# Patient Record
Sex: Female | Born: 1975 | Hispanic: Yes | Smoking: Never smoker
Health system: Southern US, Community
[De-identification: ages and names within clinical notes are randomized; demographics above are authoritative.]

## PROBLEM LIST (undated history)

## (undated) DIAGNOSIS — N393 Stress incontinence (female) (male): Secondary | ICD-10-CM

## (undated) DIAGNOSIS — N83202 Unspecified ovarian cyst, left side: Secondary | ICD-10-CM

## (undated) DIAGNOSIS — I4711 Inappropriate sinus tachycardia, so stated: Secondary | ICD-10-CM

## (undated) DIAGNOSIS — Z8489 Family history of other specified conditions: Secondary | ICD-10-CM

## (undated) DIAGNOSIS — N920 Excessive and frequent menstruation with regular cycle: Secondary | ICD-10-CM

## (undated) DIAGNOSIS — D649 Anemia, unspecified: Secondary | ICD-10-CM

## (undated) DIAGNOSIS — N946 Dysmenorrhea, unspecified: Secondary | ICD-10-CM

## (undated) DIAGNOSIS — I7300 Raynaud's syndrome without gangrene: Secondary | ICD-10-CM

## (undated) DIAGNOSIS — R5382 Chronic fatigue, unspecified: Secondary | ICD-10-CM

## (undated) DIAGNOSIS — R002 Palpitations: Secondary | ICD-10-CM

## (undated) HISTORY — PX: TUBAL LIGATION: SHX77

---

## 2017-11-17 ENCOUNTER — Ambulatory Visit
Admission: EM | Admit: 2017-11-17 | Discharge: 2017-11-17 | Disposition: A | Discharge status: Home / Self Care | Payer: Medicaid Other | Attending: Family Medicine | Admitting: Family Medicine

## 2017-11-17 ENCOUNTER — Encounter: Payer: Self-pay | Admitting: *Deleted

## 2017-11-17 DIAGNOSIS — R040 Epistaxis: Secondary | ICD-10-CM

## 2017-11-17 DIAGNOSIS — G43009 Migraine without aura, not intractable, without status migrainosus: Secondary | ICD-10-CM

## 2017-11-17 MED ORDER — HYDROCODONE-ACETAMINOPHEN 5-325 MG PO TABS: ORAL_TABLET | ORAL | 0 refills | Status: DC

## 2017-11-17 NOTE — ED Triage Notes (Signed)
Intermittent headache for past month. Spontaneous nosebleed and headache while seated at rest approx 1530 today. States stabbing type pain to left occipital region which is worse today than previously.

## 2017-11-20 ENCOUNTER — Telehealth: Payer: Self-pay

## 2017-11-20 NOTE — Telephone Encounter (Signed)
Called to follow up with patient [facility].No answer. No VM set up. Patient will call back with any questions or concerns. [facility]

## 2018-02-23 NOTE — ED Provider Notes (Signed)
[facility]    CSN: [id number] Arrival date & time: 11/17/17  1739     History   Chief Complaint Chief Complaint  Patient presents with  . Headache  . Epistaxis    HPI Lauren Barron is a 42 y.o. female.   42 yo female with a h/o migraine headaches, presents with a c/o intermittent migraine headaches for the past month. Today patient had a nosebleed which has since resolved. Denies any fevers, chills, vision changes, one-sided weakness, "worst headache ever", injuries.   The history is provided by the patient.  Headache  Epistaxis  Associated symptoms: headaches     History reviewed. No pertinent past medical history.  There are no active problems to display for this patient.   Past Surgical History:  Procedure Laterality Date  . CESAREAN SECTION      OB History   None      Home Medications    Prior to Admission medications   Medication Sig Start Date End Date Taking? Authorizing Provider  HYDROcodone-acetaminophen (NORCO/VICODIN) 5-325 MG tablet 1-2 tabs po q 8 hours prn 11/17/17   Payton Mccallumonty, Brittley Regner, MD    Family History Family History  Problem Relation Age of Onset  . Hypertension Mother   . Diabetes Father     Social History Social History   Tobacco Use  . Smoking status: Never Smoker  . Smokeless tobacco: Never Used  Substance Use Topics  . Alcohol use: No    Frequency: Never  . Drug use: No     Allergies   Aspirin   Review of Systems Review of Systems  HENT: Positive for nosebleeds.   Neurological: Positive for headaches.     Physical Exam Triage Vital Signs ED Triage Vitals  Enc Vitals Group     BP 11/17/17 1824 117/67     Pulse Rate 11/17/17 1824 82     Resp 11/17/17 1824 16     Temp 11/17/17 1824 98.8 F (37.1 C)     Temp Source 11/17/17 1824 Oral     SpO2 11/17/17 1824 100 %     Weight 11/17/17 1826 106 lb (48.1 kg)     Height 11/17/17 1826 5\' 4"  (1.626 m)     Head Circumference --      Peak Flow --        Pain Score 11/17/17 1827 5     Pain Loc --      Pain Edu? --      Excl. in GC? --    No data found.  Updated Vital Signs BP 117/67 (BP Location: Left Arm)   Pulse 94   Temp 98.8 F (37.1 C) (Oral)   Resp 16   Ht 5\' 4"  (1.626 m)   Wt 106 lb (48.1 kg)   LMP 11/03/2017 (Approximate)   SpO2 100%   BMI 18.19 kg/m   Visual Acuity Right Eye Distance:   Left Eye Distance:   Bilateral Distance:    Right Eye Near:   Left Eye Near:    Bilateral Near:     Physical Exam  Constitutional: She is oriented to person, place, and time. She appears well-developed and well-nourished. No distress.  HENT:  Head: Normocephalic.  Right Ear: Tympanic membrane, external ear and ear canal normal.  Left Ear: Tympanic membrane, external ear and ear canal normal.  Nose: Nose normal. No epistaxis.  Mouth/Throat: Oropharynx is clear and moist and mucous membranes are normal.  Eyes: Pupils are equal, round, and reactive to light.  Conjunctivae and EOM are normal. Right eye exhibits no discharge. Left eye exhibits no discharge. No scleral icterus.  Neck: Normal range of motion. Neck supple. No JVD present. No tracheal deviation present. No thyromegaly present.  Cardiovascular: Normal rate, regular rhythm, normal heart sounds and intact distal pulses.  No murmur heard. Pulmonary/Chest: Effort normal and breath sounds normal. No stridor. No respiratory distress. She has no wheezes. She has no rales. She exhibits no tenderness.  Musculoskeletal: She exhibits no edema or tenderness.  Lymphadenopathy:    She has no cervical adenopathy.  Neurological: She is alert and oriented to person, place, and time. She has normal reflexes. She displays normal reflexes. No cranial nerve deficit or sensory deficit. She exhibits normal muscle tone. Coordination normal.  Skin: Skin is warm and dry. No rash noted. She is not diaphoretic. No erythema. No pallor.  Psychiatric: She has a normal mood and affect. Her behavior  is normal. Judgment and thought content normal.  Vitals reviewed.    UC Treatments / Results  Labs (all labs ordered are listed, but only abnormal results are displayed) Labs Reviewed - No data to display  EKG None Radiology No results found.  Procedures Procedures (including critical care time)  Medications Ordered in UC Medications - No data to display   Initial Impression / Assessment and Plan / UC Course  I have reviewed the triage vital signs and the nursing notes.  Pertinent labs & imaging results that were available during my care of the patient were reviewed by me and considered in my medical decision making (see chart for details).       Final Clinical Impressions(s) / UC Diagnoses   Final diagnoses:  Migraine without aura and without status migrainosus, not intractable  Epistaxis    ED Discharge Orders        Ordered    HYDROcodone-acetaminophen (NORCO/VICODIN) 5-325 MG tablet     11/17/17 1849     1. diagnosis reviewed with patient 2. rx as per orders above; reviewed possible side effects, interactions, risks and benefits  3. Recommend supportive treatment with otc tylenol prn 4. Follow-up prn if symptoms worsen or don't improve  Controlled Substance Prescriptions [place] Controlled Substance Registry consulted? Not Applicable   Payton Mccallum, MD 02/23/18 [phone]

## 2018-04-27 ENCOUNTER — Encounter (INDEPENDENT_AMBULATORY_CARE_PROVIDER_SITE_OTHER): Payer: Self-pay

## 2018-04-27 ENCOUNTER — Ambulatory Visit
Admission: RE | Admit: 2018-04-27 | Discharge: 2018-04-27 | Disposition: A | Discharge status: Home / Self Care | Payer: Medicaid Other | Source: Ambulatory Visit | Attending: Oncology | Admitting: Oncology

## 2018-04-27 ENCOUNTER — Ambulatory Visit: Payer: Self-pay | Attending: Oncology

## 2018-04-27 VITALS — BP 107/70 | HR 87 | Temp 99.0°F | Ht 66.0 in | Wt 107.0 lb

## 2018-04-27 DIAGNOSIS — N63 Unspecified lump in unspecified breast: Secondary | ICD-10-CM

## 2018-04-27 NOTE — Progress Notes (Signed)
  Subjective:     Patient ID: Lauren MulliganZoar Moran Barron, female   DOB: 03/25/1976, 42 y.o.   MRN: [id number]  HPI   Review of Systems     Objective:   Physical Exam  Pulmonary/Chest: Right breast exhibits no inverted nipple, no mass, no nipple discharge, no skin change and no tenderness. Left breast exhibits mass. Left breast exhibits no inverted nipple (1.5 cm left breast mass 2:30 4 cm. from areola), no nipple discharge, no skin change and no tenderness. Breasts are symmetrical.         Assessment:     42 year old hispanic [place] from [place] [facility].  States she felt left breast lump over a week ago, and is also able to see lump.  Patient screened, and meets B[facility]CP eligibility.  Patient does not have insurance, Medicare or Medicaid.  Handout given on Affordable Care Act.  Instructed patient on breast self awareness using teach back method.  Palpated 1.5 cm, firm , mobile mass at 2:30 left breast, 4cm. from areola.  Explained benefit of being in [facility].  Patient states she is a [place] Citizen.      Plan:     Sent for bilateral diagnostic mammogram, and ultrasound.

## 2018-05-01 NOTE — Progress Notes (Signed)
Letter mailed from [facility] to notify of normal mammogram results.  Patient to return in one year for annual screening.  Copy to [facility]. 

## 2020-01-21 ENCOUNTER — Ambulatory Visit: Payer: Medicaid Other | Attending: Internal Medicine

## 2020-01-21 DIAGNOSIS — Z23 Encounter for immunization: Secondary | ICD-10-CM

## 2020-01-21 NOTE — Progress Notes (Signed)
   Covid-19 Vaccination Clinic  Name:  Lauren Barron    MRN: [id number] DOB: 10/15/1976  01/21/2020  Ms. Lauren Barron was observed post Covid-19 immunization for 15 minutes without incident. She was provided with Vaccine Information Sheet and instruction to access the V-Safe system.   Ms. Lauren Barron was instructed to call 911 with any severe reactions post vaccine: Marland Kitchen Difficulty breathing  . Swelling of face and throat  . A fast heartbeat  . A bad rash all over body  . Dizziness and weakness   Immunizations Administered    Name Date Dose VIS Date Route   Pfizer COVID-19 Vaccine 01/21/2020 11:28 AM 0.3 mL 10/13/2019 Intramuscular   Manufacturer: [employer], [employer]   Lot: [id number]   NDC: [address]-6

## 2020-02-11 ENCOUNTER — Ambulatory Visit: Payer: Medicaid Other

## 2020-02-25 ENCOUNTER — Ambulatory Visit: Payer: Medicaid Other | Attending: Internal Medicine

## 2020-02-25 DIAGNOSIS — Z23 Encounter for immunization: Secondary | ICD-10-CM

## 2020-02-25 NOTE — Progress Notes (Signed)
   Covid-19 Vaccination Clinic  Name:  Lauren Barron    MRN: [id number] DOB: 09-03-1976  02/25/2020  Ms. Lauren Barron was observed post Covid-19 immunization for 15 minutes without incident. She was provided with Vaccine Information Sheet and instruction to access the V-Safe system.   Ms. Lauren Barron was instructed to call 911 with any severe reactions post vaccine: Marland Kitchen Difficulty breathing  . Swelling of face and throat  . A fast heartbeat  . A bad rash all over body  . Dizziness and weakness   Immunizations Administered    Name Date Dose VIS Date Route   Pfizer COVID-19 Vaccine 02/25/2020  2:41 PM 0.3 mL 12/27/2018 Intramuscular   Manufacturer: [employer], [employer]   Lot: [id number]   NDC: [address]-4

## 2020-02-26 ENCOUNTER — Ambulatory Visit: Payer: Medicaid Other

## 2020-11-26 ENCOUNTER — Other Ambulatory Visit: Payer: Self-pay

## 2020-11-26 ENCOUNTER — Emergency Department
Admission: EM | Admit: 2020-11-26 | Discharge: 2020-11-26 | Disposition: A | Discharge status: Home / Self Care | Payer: 59 | Attending: Emergency Medicine | Admitting: Emergency Medicine

## 2020-11-26 ENCOUNTER — Emergency Department: Payer: 59

## 2020-11-26 DIAGNOSIS — R0789 Other chest pain: Secondary | ICD-10-CM

## 2020-11-26 DIAGNOSIS — R0602 Shortness of breath: Secondary | ICD-10-CM | POA: Insufficient documentation

## 2020-11-26 DIAGNOSIS — R079 Chest pain, unspecified: Secondary | ICD-10-CM | POA: Diagnosis present

## 2020-11-26 LAB — CBC
HCT: 36.2 % (ref 36.0–46.0)
Hemoglobin: 11.6 g/dL — ABNORMAL LOW (ref 12.0–15.0)
MCH: 26.7 pg (ref 26.0–34.0)
MCHC: 32 g/dL (ref 30.0–36.0)
MCV: 83.4 fL (ref 80.0–100.0)
Platelets: 337 10*3/uL (ref 150–400)
RBC: 4.34 MIL/uL (ref 3.87–5.11)
RDW: 14.9 % (ref 11.5–15.5)
WBC: 3.2 10*3/uL — ABNORMAL LOW (ref 4.0–10.5)
nRBC: 0 % (ref 0.0–0.2)

## 2020-11-26 LAB — BASIC METABOLIC PANEL
Anion gap: 9 (ref 5–15)
BUN: 7 mg/dL (ref 6–20)
CO2: 25 mmol/L (ref 22–32)
Calcium: 9.2 mg/dL (ref 8.9–10.3)
Chloride: 106 mmol/L (ref 98–111)
Creatinine, Ser: 0.63 mg/dL (ref 0.44–1.00)
GFR, Estimated: >60 mL/min (ref >60)
Glucose, Bld: 89 mg/dL (ref 70–99)
Potassium: 4.1 mmol/L (ref 3.5–5.1)
Sodium: 140 mmol/L (ref 135–145)

## 2020-11-26 LAB — TROPONIN I (HIGH SENSITIVITY): Troponin I (High Sensitivity): <2 ng/L (ref <18)

## 2020-11-26 MED ORDER — IOHEXOL 350 MG/ML SOLN
75.0000 mL | Freq: Once | INTRAVENOUS | Status: AC | PRN
  Administered 2020-11-26: 75 mL via INTRAVENOUS
  Filled 2020-11-26: qty 75

## 2020-11-26 NOTE — ED Triage Notes (Signed)
Pt comes with c/o central Chest pressure that started last night. Pt states SOB as well. Pt states it has gotten worse.   Pt denies any N/V.

## 2020-11-26 NOTE — ED Provider Notes (Signed)
[name] Emergency Department Provider Note   ____________________________________________    I have reviewed the triage vital signs and the nursing notes.   HISTORY  Chief Complaint Chest Pain     HPI Lauren Barron is a 45 y.o. female who presents with complaints of chest discomfort and shortness of breath.  Patient reports she noted last night when she was walking up her stairs that she felt mildly out of breath and had some chest discomfort.  She thought it would resolve overnight but she still felt that way this morning.  She denies history of heart disease.  No fevers chills or cough, some fatigue.  Has not take anything for this.  Reports similar episode happened several weeks ago and resolved on its own.  No calf pain or swelling, no hormonal therapy History reviewed. No pertinent past medical history.  There are no problems to display for this patient.   Past Surgical History:  Procedure Laterality Date  . CESAREAN SECTION      Prior to Admission medications   Medication Sig Start Date End Date Taking? Authorizing Provider  HYDROcodone-acetaminophen (NORCO/VICODIN) 5-325 MG tablet 1-2 tabs po q 8 hours prn 11/17/17   Payton Mccallum, MD     Allergies Aspirin  Family History  Problem Relation Age of Onset  . Hypertension Mother   . Diabetes Father     Social History Social History   Tobacco Use  . Smoking status: Never Smoker  . Smokeless tobacco: Never Used  Substance Use Topics  . Alcohol use: No  . Drug use: No    Review of Systems  Constitutional: No fever/chills Eyes: No visual changes.  ENT: No sore throat. Cardiovascular: As above Respiratory: As above Gastrointestinal: No abdominal pain.  No nausea, no vomiting.   Genitourinary: Negative for dysuria. Musculoskeletal: Negative for back pain. Skin: Negative for rash. Neurological: Negative for headaches or  weakness   ____________________________________________   PHYSICAL EXAM:  VITAL SIGNS: ED Triage Vitals  Enc Vitals Group     BP 11/26/20 1012 128/73     Pulse Rate 11/26/20 1012 94     Resp 11/26/20 1012 18     Temp 11/26/20 1012 98.5 F (36.9 C)     Temp Source 11/26/20 1127 Oral     SpO2 11/26/20 1012 100 %     Weight 11/26/20 1010 55.8 kg (123 lb)     Height 11/26/20 1010 1.626 m (5\' 4" )     Head Circumference --      Peak Flow --      Pain Score 11/26/20 1010 6     Pain Loc --      Pain Edu? --      Excl. in GC? --     Constitutional: Alert and oriented.   Nose: No congestion/rhinnorhea. Mouth/Throat: Mucous membranes are moist.   Neck:  Painless ROM Cardiovascular: Normal rate, regular rhythm. Grossly normal heart sounds.  Good peripheral circulation. Respiratory: Normal respiratory effort.  No retractions. Lungs CTAB. Gastrointestinal: Soft and nontender. No distention.  No CVA tenderness.  Musculoskeletal: No lower extremity tenderness nor edema.  Warm and well perfused Neurologic:  Normal speech and language. No gross focal neurologic deficits are appreciated.  Skin:  Skin is warm, dry and intact. No rash noted. Psychiatric: Mood and affect are normal. Speech and behavior are normal.  ____________________________________________   LABS (all labs ordered are listed, but only abnormal results are displayed)  Labs Reviewed  CBC -  Abnormal; Notable for the following components:      Result Value   WBC 3.2 (*)    Hemoglobin 11.6 (*)    All other components within normal limits  BASIC METABOLIC PANEL  POC URINE PREG, ED  TROPONIN I (HIGH SENSITIVITY)   ____________________________________________  EKG  ED ECG REPORT I, Jene Every, the attending physician, personally viewed and interpreted this ECG.  Date: 11/26/2020  Rhythm: normal sinus rhythm QRS Axis: normal Intervals: normal ST/T Wave abnormalities: normal Narrative Interpretation: no  evidence of acute ischemia  ____________________________________________  RADIOLOGY  CT angiography negative for PE, reviewed by me ____________________________________________   PROCEDURES  Procedure(s) performed: No  Procedures   Critical Care performed: No ____________________________________________   INITIAL IMPRESSION / ASSESSMENT AND PLAN / ED COURSE  Pertinent labs & imaging results that were available during my care of the patient were reviewed by me and considered in my medical decision making (see chart for details).  Patient presents with shortness of breath chest discomfort noted when walking up the stairs last night.  Somewhat improved today although still present.  Possibly some mild pleurisy, no significant risk factors for PE but it is certainly on the differential.  Low risk for ACS, EKG is normal.  Troponin is nondetectable.  Lab work is otherwise reassuring.  Exam is unremarkable.  Sent for CT angiography to rule out PE which was normal.  We will have the patient follow-up with cardiology, strict precautions discussed    ____________________________________________   FINAL CLINICAL IMPRESSION(S) / ED DIAGNOSES  Final diagnoses:  Atypical chest pain        Note:  This document was prepared using Dragon voice recognition software and may include unintentional dictation errors.   Jene Every, MD 11/26/20 [phone]

## 2020-11-26 NOTE — ED Notes (Signed)
See triage note  Presents with some fatigue and chest pressure  States she does have some SOB with exertion  No fever or cough

## 2020-12-06 DIAGNOSIS — R000 Tachycardia, unspecified: Secondary | ICD-10-CM | POA: Insufficient documentation

## 2020-12-06 DIAGNOSIS — R0602 Shortness of breath: Secondary | ICD-10-CM | POA: Insufficient documentation

## 2020-12-06 DIAGNOSIS — R079 Chest pain, unspecified: Secondary | ICD-10-CM | POA: Insufficient documentation

## 2020-12-11 ENCOUNTER — Emergency Department: Payer: 59

## 2020-12-11 ENCOUNTER — Encounter: Payer: Self-pay | Admitting: Physician Assistant

## 2020-12-11 ENCOUNTER — Emergency Department
Admission: EM | Admit: 2020-12-11 | Discharge: 2020-12-11 | Disposition: A | Discharge status: Home / Self Care | Payer: 59 | Attending: Emergency Medicine | Admitting: Emergency Medicine

## 2020-12-11 ENCOUNTER — Other Ambulatory Visit: Payer: Self-pay

## 2020-12-11 DIAGNOSIS — R0602 Shortness of breath: Secondary | ICD-10-CM | POA: Diagnosis not present

## 2020-12-11 DIAGNOSIS — M79662 Pain in left lower leg: Secondary | ICD-10-CM | POA: Diagnosis present

## 2020-12-11 MED ORDER — CYCLOBENZAPRINE HCL 5 MG PO TABS: 5.0000 mg | ORAL_TABLET | Freq: Three times a day (TID) | ORAL | 0 refills | Status: DC | PRN

## 2020-12-11 NOTE — Discharge Instructions (Signed)
Your exam and Ultrasound were normal today. You may take the muscle relaxant as prescribed. Follow-up with Dr. Nemiah Commander as planned. Return to the ED as needed.

## 2020-12-11 NOTE — ED Notes (Signed)
Registration at bedside /  Family at bedside

## 2020-12-11 NOTE — ED Provider Notes (Signed)
[facility] Emergency Department Provider Note ____________________________________________  Time seen: 1058  I have reviewed the triage vital signs and the nursing notes.  HISTORY  Chief Complaint  Leg Pain  HPI Lauren Barron is a 45 y.o. female presents to the ED for evaluation of  mitten pain to the left calf that started last night.  Patient describes this morning she noted that her toes on the left foot, appeared to be dark in color "turning purple" when she was sitting on the edge of a chair.  She presents today for evaluation of her complaints.  Patient reports the color seem to return as she got up to walk.  She denies any frank substernal chest pain.  She has had some intermittent shortness of breath associated with activity and walking.  This is been present for weeks and she is currently under the care of cardiology for evaluation, with a Holter monitor in place.  Patient denies any fever, chills, sweats patient also denies any edema to the lower extremities.  History reviewed. No pertinent past medical history.  There are no problems to display for this patient.   Past Surgical History:  Procedure Laterality Date  . CESAREAN SECTION      Prior to Admission medications   Medication Sig Start Date End Date Taking? Authorizing Provider  cyclobenzaprine (FLEXERIL) 5 MG tablet Take 1 tablet (5 mg total) by mouth 3 (three) times daily as needed. 12/11/20  Yes Margo Lama, Charlesetta Ivory, PA-C  HYDROcodone-acetaminophen (NORCO/VICODIN) 5-325 MG tablet 1-2 tabs po q 8 hours prn 11/17/17   Payton Mccallum, MD    Allergies Aspirin  Family History  Problem Relation Age of Onset  . Hypertension Mother   . Diabetes Father     Social History Social History   Tobacco Use  . Smoking status: Never Smoker  . Smokeless tobacco: Never Used  Substance Use Topics  . Alcohol use: No  . Drug use: No    Review of Systems  Constitutional: Negative for  fever. Eyes: Negative for visual changes. ENT: Negative for sore throat. Cardiovascular: Negative for chest pain.  Poor blood flow to the left foot toes as is noted above. Respiratory: Negative for shortness of breath. Gastrointestinal: Negative for abdominal pain, vomiting and diarrhea. Genitourinary: Negative for dysuria. Musculoskeletal: Negative for back pain. Skin: Negative for rash. Neurological: Negative for headaches, focal weakness or numbness. ____________________________________________  PHYSICAL EXAM:  VITAL SIGNS: ED Triage Vitals  Enc Vitals Group     BP 12/11/20 1017 132/86     Pulse Rate 12/11/20 1017 95     Resp 12/11/20 1017 17     Temp 12/11/20 1017 98.5 F (36.9 C)     Temp Source 12/11/20 1017 Oral     SpO2 12/11/20 1021 97 %     Weight 12/11/20 1021 123 lb (55.8 kg)     Height 12/11/20 1021 5\' 4"  (1.626 m)     Head Circumference --      Peak Flow --      Pain Score 12/11/20 1021 0     Pain Loc --      Pain Edu? --      Excl. in GC? --     Constitutional: Alert and oriented. Well appearing and in no distress. Head: Normocephalic and atraumatic. Eyes: Conjunctivae are normal. Normal extraocular movements Cardiovascular: Normal rate, regular rhythm. Normal distal pulses and brisk cap refill. Negative Homans' sign. Respiratory: Normal respiratory effort. No wheezes/rales/rhonchi. Musculoskeletal: Left leg  without obvious deformity, bruising, effusions at the joints, or calf tenderness.    Nontender with normal range of motion in all extremities.  Neurologic:  Normal gait without ataxia. Normal speech and language. No gross focal neurologic deficits are appreciated. Skin:  Skin is warm, dry and intact.  No erythema, edema, ecchymosis, or rubor noted to the toes.  No cyanosis or clubbing noted distally. Psychiatric: Mood and affect are normal. Patient exhibits appropriate insight and judgment. ____________________________________________  EKG  Vent. rate  95 BPM PR interval 134 ms QRS duration 74 ms QT/QTc 348/437 ms P-R-T axes 44 56 -3 No STEMI ____________________________________________   RADIOLOGY  US Venous LLE  IMPRESSION: No evidence of femoropopliteal DVT within the left lower extremity. ____________________________________________  PROCEDURES  Procedures ____________________________________________  INITIAL IMPRESSION / ASSESSMENT AND PLAN / ED COURSE  DDX: DVT, thrombophlebitis, calf strain  Patient ED evaluation of posterior calf pain in the left.  She was concern for possible DVT presented to the ED.  Patient exam is overall benign return at this time.  No signs of acute DVT or thrombophlebitis on ultrasound or exam.  Patient is reassured at this time.  She is discharged to follow-up with her primary provider or cardiology as planned.  Return precautions have been discussed.  A small prescription for Flexeril was provided for benefit.   Lauren Barron was evaluated in Emergency Department on 12/11/2020 for the symptoms described in the history of present illness. She was evaluated in the context of the global COVID-19 pandemic, which necessitated consideration that the patient might be at risk for infection with the SARS-CoV-2 virus that causes COVID-19. Institutional protocols and algorithms that pertain to the evaluation of patients at risk for COVID-19 are in a state of rapid change based on information released by regulatory bodies including the CDC and federal and state organizations. These policies and algorithms were followed during the patient's care in the ED. ____________________________________________  FINAL CLINICAL IMPRESSION(S) / ED DIAGNOSES  Final diagnoses:  Pain of left calf      Karmen Stabs, Charlesetta Ivory, PA-C 12/11/20 1938    Chesley Noon, MD 12/12/20 [phone]

## 2020-12-11 NOTE — ED Triage Notes (Signed)
Pt to ED for chief complaint of left leg pain that started last night intermittently, states this morning she noticed her toes were turning purple, color if WDL at this time.  Pt states CP and shob have been going on for weeks, sx have not changed, was already seen for sx and is wearing a heart monitor for it.   Pt in NAD, RR Even and unlabored.   Discussed pt with Dr Marisa Severin. EKG only at this time

## 2020-12-11 NOTE — ED Notes (Signed)
Pt ambulated to the bathroom.  

## 2020-12-11 NOTE — ED Notes (Signed)
[employer] at bedside

## 2021-01-15 ENCOUNTER — Other Ambulatory Visit (INDEPENDENT_AMBULATORY_CARE_PROVIDER_SITE_OTHER): Payer: Self-pay | Admitting: Vascular Surgery

## 2021-01-15 DIAGNOSIS — L819 Disorder of pigmentation, unspecified: Secondary | ICD-10-CM

## 2021-01-16 ENCOUNTER — Other Ambulatory Visit: Payer: Self-pay

## 2021-01-16 ENCOUNTER — Ambulatory Visit (INDEPENDENT_AMBULATORY_CARE_PROVIDER_SITE_OTHER): Payer: 59

## 2021-01-16 ENCOUNTER — Ambulatory Visit (INDEPENDENT_AMBULATORY_CARE_PROVIDER_SITE_OTHER): Payer: 59 | Admitting: Vascular Surgery

## 2021-01-16 ENCOUNTER — Encounter (INDEPENDENT_AMBULATORY_CARE_PROVIDER_SITE_OTHER): Payer: Self-pay | Admitting: Vascular Surgery

## 2021-01-16 DIAGNOSIS — L819 Disorder of pigmentation, unspecified: Secondary | ICD-10-CM | POA: Diagnosis not present

## 2021-01-16 DIAGNOSIS — I7300 Raynaud's syndrome without gangrene: Secondary | ICD-10-CM | POA: Insufficient documentation

## 2021-01-16 NOTE — Progress Notes (Signed)
MRN : [id number]  Catelyn Abagail Limb is a 45 y.o. (08-01-1976) female who presents with chief complaint of No chief complaint on file. Marland Kitchen  History of Present Illness:   The patient is seen for the evaluation of color changes of her toes consistent with Raynaud's changes. The patient notes her toes blue and become cold feeling an uncomfortable. This began a couple months ago and was associated with changes of her heart rate and symptoms c/w a panic attack.    The patient has not been taking Norvasc  There is no history of malignancy or autoimmune disease.  The patient denies amaurosis fugax or recent TIA symptoms. There are no recent neurological changes noted. The patient denies claudication symptoms or rest pain symptoms. The patient denies history of DVT, PE or superficial thrombophlebitis. The patient denies recent episodes of angina or shortness of breath.    No outpatient medications have been marked as taking for the 01/16/21 encounter (Appointment) with Gilda Crease, Latina Craver, MD.    No past medical history on file.  Past Surgical History:  Procedure Laterality Date  . CESAREAN SECTION      Social History Social History   Tobacco Use  . Smoking status: Never Smoker  . Smokeless tobacco: Never Used  Substance Use Topics  . Alcohol use: No  . Drug use: No    Family History Family History  Problem Relation Age of Onset  . Hypertension Mother   . Diabetes Father     Allergies  Allergen Reactions  . Aspirin Hives and Other (See Comments)    tachycardia     REVIEW OF SYSTEMS (Negative unless checked)  Constitutional: [] Weight loss  [] Fever  [] Chills Cardiac: [] Chest pain   [] Chest pressure   [] Palpitations   [] Shortness of breath when laying flat   [] Shortness of breath with exertion. Vascular:  [] Pain in legs with walking   [] Pain in legs at rest  [] History of DVT   [] Phlebitis   [] Swelling in legs   [] Varicose veins   [] Non-healing ulcers Pulmonary:    [] Uses home oxygen   [] Productive cough   [] Hemoptysis   [] Wheeze  [] COPD   [] Asthma Neurologic:  [] Dizziness   [] Seizures   [] History of stroke   [] History of TIA  [] Aphasia   [] Vissual changes   [] Weakness or numbness in arm   [] Weakness or numbness in leg Musculoskeletal:   [] Joint swelling   [] Joint pain   [] Low back pain Hematologic:  [] Easy bruising  [] Easy bleeding   [] Hypercoagulable state   [] Anemic Gastrointestinal:  [] Diarrhea   [] Vomiting  [] Gastroesophageal reflux/heartburn   [] Difficulty swallowing. Genitourinary:  [] Chronic kidney disease   [] Difficult urination  [] Frequent urination   [] Blood in urine Skin:  [] Rashes   [] Ulcers  Psychological:  [] History of anxiety   []  History of major depression.  Physical Examination  There were no vitals filed for this visit. There is no height or weight on file to calculate BMI. Gen: WD/WN, NAD Head: [place]/AT, No temporalis wasting.  Ear/Nose/Throat: Hearing grossly intact, nares w/o erythema or drainage Eyes: PER, EOMI, sclera nonicteric.  Neck: Supple, no large masses.   Pulmonary:  Good air movement, no audible wheezing bilaterally, no use of accessory muscles.  Cardiac: RRR, no JVD Vascular: mild bluish discoloration of the toes bilaterally Vessel Right Left  Radial Palpable Palpable  PT Palpable Palpable  DP Palpable Palpable  Gastrointestinal: Non-distended. No guarding/no peritoneal signs.  Musculoskeletal: M/S 5/5 throughout.  No deformity or atrophy.  Neurologic: CN 2-12 intact. Symmetrical.  Speech is fluent. Motor exam as listed above. Psychiatric: Judgment intact, Mood & affect appropriate for pt's clinical situation. Dermatologic: No rashes or ulcers noted.  No changes consistent with cellulitis.   CBC Lab Results  Component Value Date   WBC 3.2 (L) 11/26/2020   HGB 11.6 (L) 11/26/2020   HCT 36.2 11/26/2020   MCV 83.4 11/26/2020   PLT 337 11/26/2020    BMET    Component Value Date/Time   NA 140 11/26/2020  1012   K 4.1 11/26/2020 1012   CL 106 11/26/2020 1012   CO2 25 11/26/2020 1012   GLUCOSE 89 11/26/2020 1012   BUN 7 11/26/2020 1012   CREATININE 0.63 11/26/2020 1012   CALCIUM 9.2 11/26/2020 1012   GFRNONAA >60 11/26/2020 1012   CrCl cannot be calculated (Patient's most recent lab result is older than the maximum 21 days allowed.).  COAG No results found for: INR, PROTIME  Radiology No results found.   Assessment/Plan Recommend:  The patient is currently tolerating the Raynaud's changes fairly well. Lengthy discussion regarding keeping the feet and the hands warm; gloves, washing with only warm water (especially avoiding cold water immersion) and using wool socks, specifically Smartwool was recommended.  Possibility of using Norvasc was discussed but it was decided to hold off for now until the benefits of conservative therapy is assessed.  The use of Pletal was also reviewed as well as the fact that it is an off label use which is typically reserved for failures of Norvasc and conservative therapy.  Also it is found to be useful in patients that have ulcerated.  The patient will follow up PRN if the changes worsen or persist.          Levora Dredge, MD  01/16/2021 12:57 PM

## 2021-01-27 ENCOUNTER — Encounter (INDEPENDENT_AMBULATORY_CARE_PROVIDER_SITE_OTHER): Payer: 59

## 2021-01-27 ENCOUNTER — Encounter (INDEPENDENT_AMBULATORY_CARE_PROVIDER_SITE_OTHER): Payer: 59 | Admitting: Vascular Surgery

## 2021-02-04 DIAGNOSIS — R002 Palpitations: Secondary | ICD-10-CM | POA: Insufficient documentation

## 2021-12-24 DIAGNOSIS — R5382 Chronic fatigue, unspecified: Secondary | ICD-10-CM | POA: Insufficient documentation

## 2021-12-27 ENCOUNTER — Ambulatory Visit
Admission: EM | Admit: 2021-12-27 | Discharge: 2021-12-27 | Disposition: A | Discharge status: Home / Self Care | Payer: 59

## 2021-12-27 ENCOUNTER — Other Ambulatory Visit: Payer: Self-pay

## 2021-12-27 DIAGNOSIS — R21 Rash and other nonspecific skin eruption: Secondary | ICD-10-CM

## 2021-12-27 DIAGNOSIS — N920 Excessive and frequent menstruation with regular cycle: Secondary | ICD-10-CM | POA: Insufficient documentation

## 2021-12-27 MED ORDER — HYDROXYZINE HCL 25 MG PO TABS: 25.0000 mg | ORAL_TABLET | Freq: Four times a day (QID) | ORAL | 0 refills | Status: DC

## 2021-12-27 MED ORDER — PREDNISONE 20 MG PO TABS: 40.0000 mg | ORAL_TABLET | Freq: Every day | ORAL | 0 refills | Status: AC

## 2021-12-27 NOTE — ED Triage Notes (Signed)
Patient is here for "Rash", First noticed yesterday. Nothing newly exposed to "known" (maybe washing detergent). No facial swelling. No respiratory distress.

## 2021-12-27 NOTE — Discharge Instructions (Addendum)
-  Prednisone, 2 pills taken at the same time for 5 days in a row.  Try taking this earlier in the day as it can give you energy. Avoid NSAIDs like ibuprofen and alleve while taking this medication as they can increase your risk of stomach upset and even GI bleeding when in combination with a steroid. You can continue tylenol (acetaminophen) up to 1000mg  3x daily. -Hydroxyzine up to every 6 hours for itching. This can cause drowsiness so take at home or before bed.  -Follow-up if symptoms worsen: rash continues to spread, new facial symptoms like swelling, new shortness of breath, sensation of throat closing etc.

## 2021-12-27 NOTE — ED Provider Notes (Signed)
[facility]    CSN: [id number] Arrival date & time: 12/27/21  3159      History   Chief Complaint Chief Complaint  Patient presents with   Rash    HPI Lauren Barron is a 46 y.o. female presenting with pruritic rash.  History noncontributory.  Presents with diffuse pruritic rash for about 24 hours. Started on L torso and spread to entire torso, back, arms, hands.  Has not attempted interventions at home.  Denies new products other than new liquid detergent, but states she has used the powder version of this before without issue.  Denies facial involvement, sensation of throat closing, shortness of breath, chest pain, dizziness.  No history of anaphylaxis.  HPI  History reviewed. No pertinent past medical history.  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      Home Medications    Prior to Admission medications   Medication Sig Start Date End Date Taking? Authorizing Provider  citalopram (CELEXA) 20 MG tablet Take by mouth. 11/20/21 11/20/22 Yes [provider]  Drospirenone 4 MG TABS Take by mouth. 09/18/21 09/18/22 Yes [provider]  hydrOXYzine (ATARAX) 25 MG tablet Take 1 tablet (25 mg total) by mouth every 6 (six) hours. 12/27/21  Yes Rhys Martini, PA-C  metoprolol succinate (TOPROL-XL) 25 MG 24 hr tablet Take 1 tablet by mouth daily. 11/20/21 11/20/22 Yes [provider]  Multiple Vitamin (MULTIVITAMIN) capsule Take 1 capsule by mouth daily.   Yes [provider]  predniSONE (DELTASONE) 20  MG tablet Take 2 tablets (40 mg total) by mouth daily for 5 days. Take with breakfast or lunch. Avoid NSAIDs (ibuprofen, etc) while taking this medication. 12/27/21 01/01/22 Yes Rhys Martini, PA-C  albuterol (VENTOLIN HFA) 108 (90 Base) MCG/ACT inhaler SMARTSIG:2 Inhalation Via Inhaler Every 6 Hours PRN 11/29/20   [provider]  cyclobenzaprine (FLEXERIL) 5 MG tablet Take 1 tablet (5 mg total) by mouth 3 (three) times daily as needed. 12/11/20   Menshew, Charlesetta Ivory, PA-C  HYDROcodone-acetaminophen (NORCO/VICODIN) 5-325 MG tablet 1-2 tabs po q 8 hours prn Patient not taking: Reported on 01/16/2021 11/17/17   Payton Mccallum, MD  hydroxychloroquine (PLAQUENIL) 200 MG tablet Take 200 mg by mouth 2 (two) times daily. Not started yet. 12/24/21   [provider]  Vitamin D, Ergocalciferol, (DRISDOL) 1.25 MG (50000 UNIT) CAPS capsule Take 50,000 Units by mouth once a week. 01/03/21   [provider]    Family History Family History  Problem Relation Age of Onset   Hypertension Mother    Diabetes Father     Social History Social History   Tobacco Use   Smoking status: Never   Smokeless tobacco: Never  Vaping Use   Vaping Use: Never used  Substance Use Topics   Alcohol use: No   Drug use: No     Allergies   Aspirin and Ketorolac tromethamine   Review of Systems Review of Systems  Skin:  Positive for rash.  All other systems reviewed and are  negative.   Physical Exam Triage Vital Signs ED Triage Vitals  Enc Vitals Group     BP 12/27/21 0944 105/65     Pulse Rate 12/27/21 0944 82     Resp 12/27/21 0944 18     Temp 12/27/21 0944 98.5 F (36.9 C)     Temp Source 12/27/21 0944 Oral     SpO2 12/27/21 0944 100 %     Weight 12/27/21 0939 130 lb (59 kg)     Height 12/27/21 0939 5\' 4"  (1.626 m)     Head Circumference --      Peak Flow --      Pain Score 12/27/21 0939 0     Pain Loc --      Pain Edu? --      Excl. in GC? --    No data found.  Updated  Vital Signs BP 105/65 (BP Location: Left Arm)    Pulse 82    Temp 98.5 F (36.9 C) (Oral)    Resp 18    Ht 5\' 4"  (1.626 m)    Wt 130 lb (59 kg)    LMP 12/08/2021 (Exact Date)    SpO2 100%    BMI 22.31 kg/m   Visual Acuity Right Eye Distance:   Left Eye Distance:   Bilateral Distance:    Right Eye Near:   Left Eye Near:    Bilateral Near:     Physical Exam Vitals reviewed.  Constitutional:      General: She is not in acute distress.    Appearance: Normal appearance. She is not ill-appearing or diaphoretic.  HENT:     Head: Normocephalic and atraumatic.  Cardiovascular:     Rate and Rhythm: Normal rate and regular rhythm.     Heart sounds: Normal heart sounds.  Pulmonary:     Effort: Pulmonary effort is normal.     Breath sounds: Normal breath sounds.  Skin:    General: Skin is warm.     Comments: Diffuse maculopapular rash on hands, arms, torso, back. Few excoriations throughout. No warmth, discharge. No facial/lip/tongue/uvula swelling. Airway patent.   Neurological:     General: No focal deficit present.     Mental Status: She is alert and oriented to person, place, and time.  Psychiatric:        Mood and Affect: Mood normal.        Behavior: Behavior normal.        Thought Content: Thought content normal.        Judgment: Judgment normal.     UC Treatments / Results  Labs (all labs ordered are listed, but only abnormal results are displayed) Labs Reviewed - No data to display  EKG   Radiology No results found.  Procedures Procedures (including critical care time)  Medications Ordered in UC Medications - No data to display  Initial Impression / Assessment and Plan / UC Course  I have reviewed the triage vital signs and the nursing notes.  Pertinent labs & imaging results that were available during my care of the patient were reviewed by me and considered in my medical decision making (see chart for details).     This patient is a very pleasant 46 y.o.  year old female presenting with allergic rash x24 hours. Rash is limited to the arms and torso, without facial involvement or concern for anaphylaxis. Will manage with prednisone and hydroxyzine as below. ED return precautions discussed. Patient verbalizes understanding and agreement. LMP 12/08/21, States she  is not pregnant or breastfeeding.  .   Final Clinical Impressions(s) / UC Diagnoses   Final diagnoses:  Rash and nonspecific skin eruption     Discharge Instructions      -Prednisone, 2 pills taken at the same time for 5 days in a row.  Try taking this earlier in the day as it can give you energy. Avoid NSAIDs like ibuprofen and alleve while taking this medication as they can increase your risk of stomach upset and even GI bleeding when in combination with a steroid. You can continue tylenol (acetaminophen) up to 1000mg  3x daily. -Hydroxyzine up to every 6 hours for itching. This can cause drowsiness so take at home or before bed.  -Follow-up if symptoms worsen: rash continues to spread, new facial symptoms like swelling, new shortness of breath, sensation of throat closing etc.      ED Prescriptions     Medication Sig Dispense Auth. Provider   predniSONE (DELTASONE) 20 MG tablet Take 2 tablets (40 mg total) by mouth daily for 5 days. Take with breakfast or lunch. Avoid NSAIDs (ibuprofen, etc) while taking this medication. 10 tablet , PA-C   hydrOXYzine (ATARAX) 25 MG tablet Take 1 tablet (25 mg total) by mouth every 6 (six) hours. 12 tablet Rhys Martini, PA-C      PDMP not reviewed this encounter.   Rhys Martini, PA-C 12/27/21 1023

## 2022-02-01 IMAGING — CT CT ANGIO CHEST
2 of 6 series · 19 of 46 positions shown · IV contrast (APPLIED)
Comparison: Chest radiograph November 26, 2020

CLINICAL DATA: Shortness of breath and chest pain

EXAM:
CT ANGIOGRAPHY CHEST WITH CONTRAST
TECHNIQUE: Multidetector CT imaging of the chest was performed using the
standard protocol during bolus administration of intravenous
contrast. Multiplanar CT image reconstructions and MIPs were
obtained to evaluate the vascular anatomy.
CONTRAST:  75mL OMNIPAQUE IOHEXOL 350 MG/ML SOLN

[Series 5: thins · axial · 0.57mm/px · z∈[-619,-375]mm · 16 of 268 slices shown]
[im 12/268  lung]
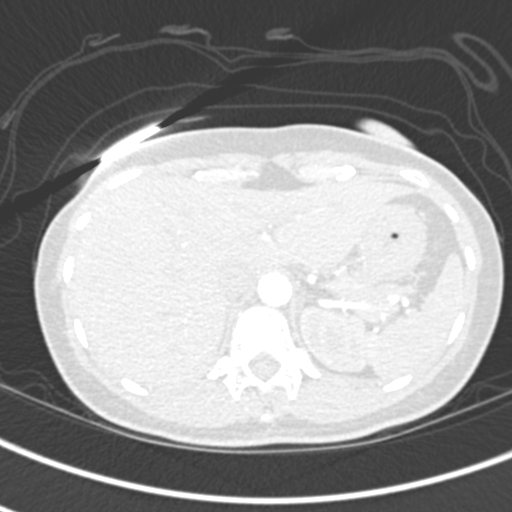
[im 35/268  soft-tissue]
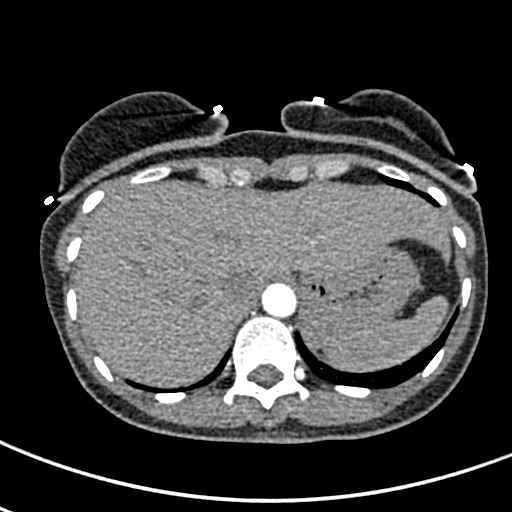
[im 47/268  lung]
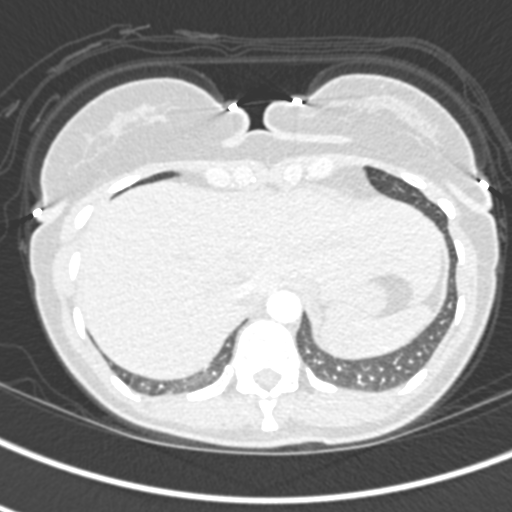
[im 59/268  soft-tissue]
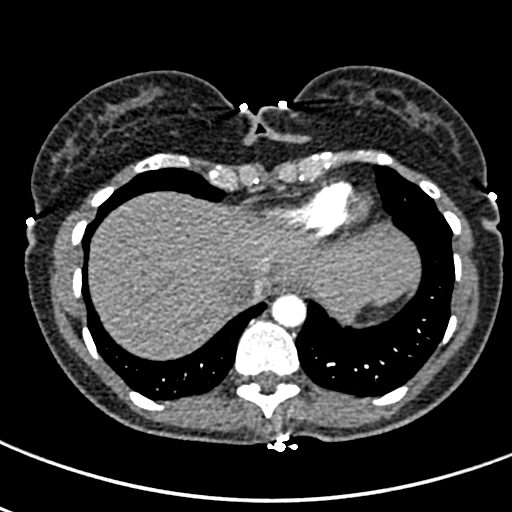
[im 82/268  lung]
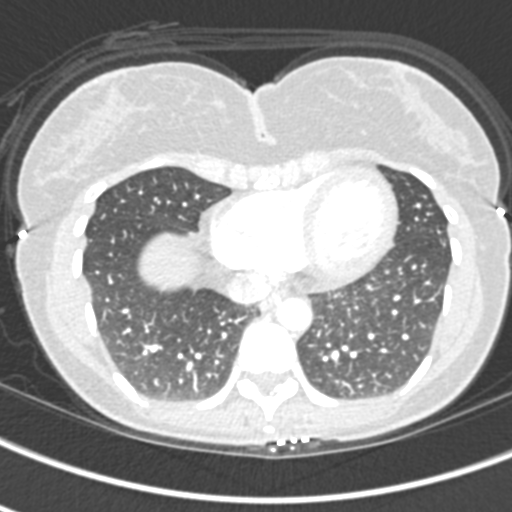
[im 93/268  soft-tissue]
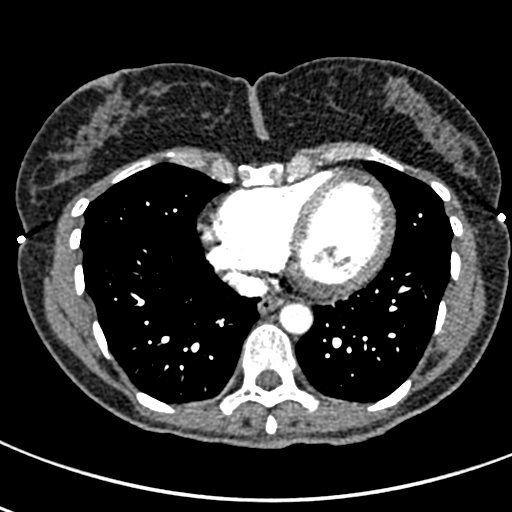
[im 105/268  lung]
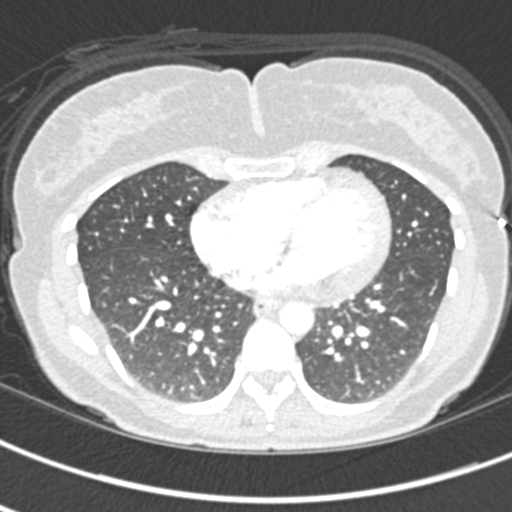
[im 128/268  soft-tissue]
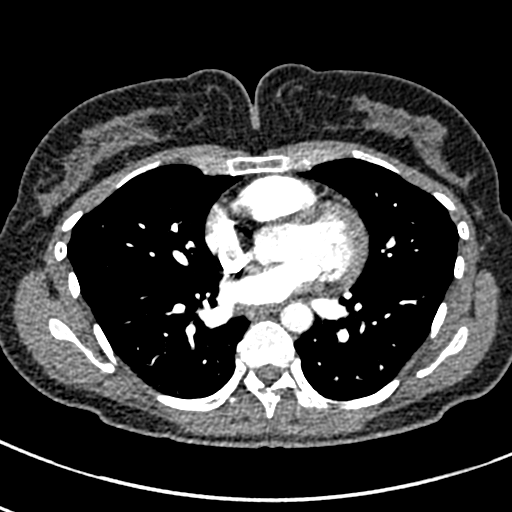
[im 140/268  lung]
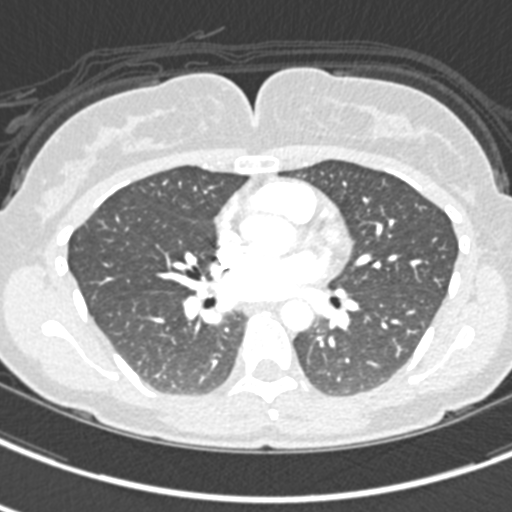
[im 163/268  soft-tissue]
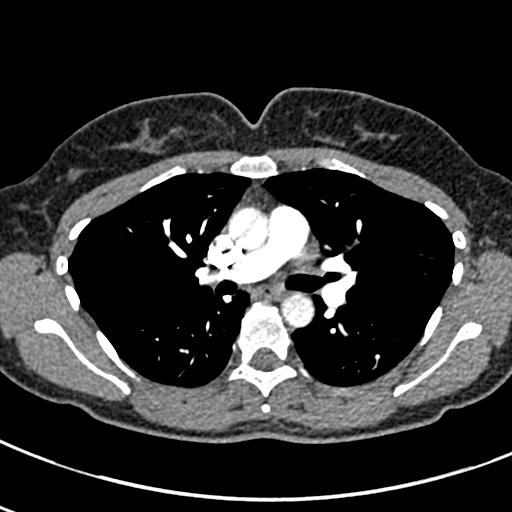
[im 175/268  lung]
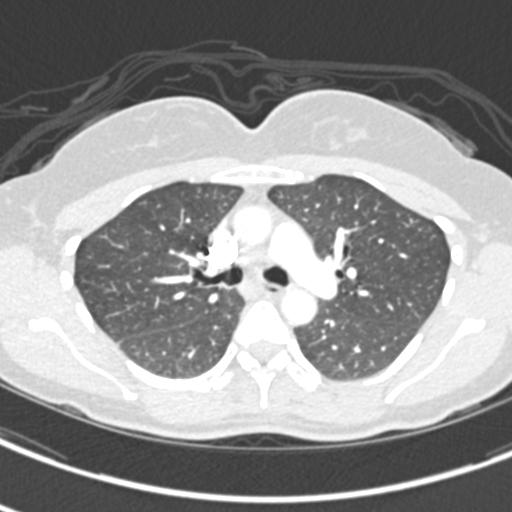
[im 186/268  soft-tissue]
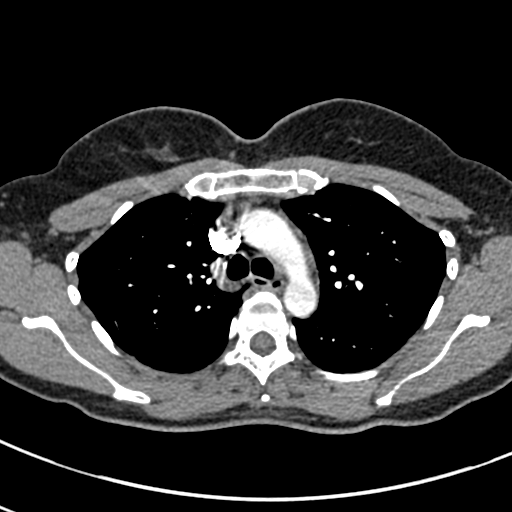
[im 209/268  lung]
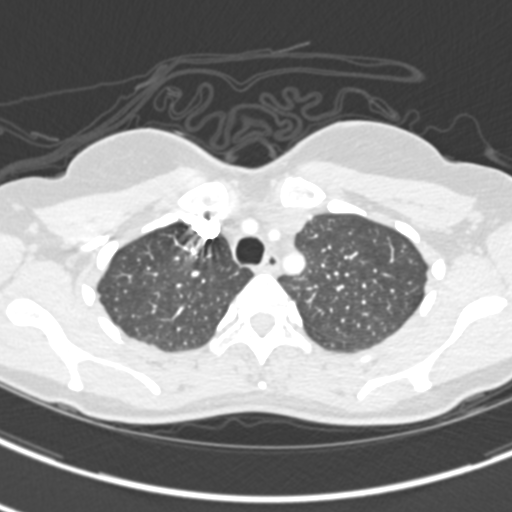
[im 221/268  soft-tissue]
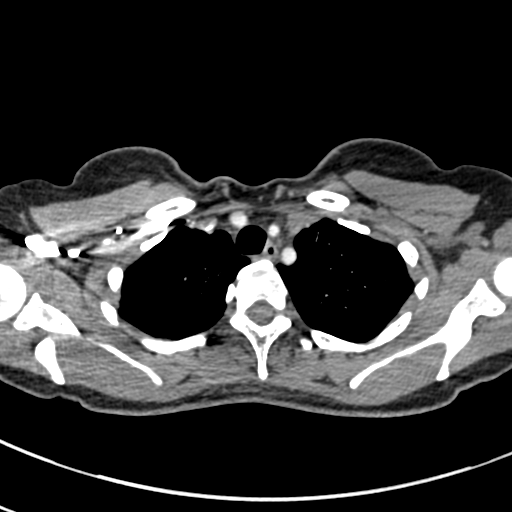
[im 233/268  lung]
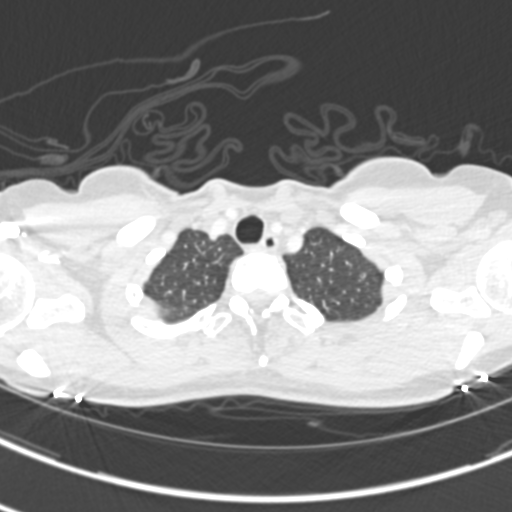
[im 256/268  soft-tissue]
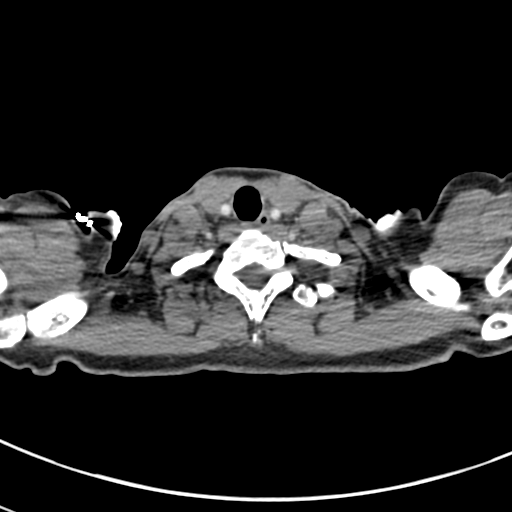

[Series 7: coronal mpr · coronal · 0.55mm/px · 3 of 64 slices shown]
[im 16/64  soft-tissue]
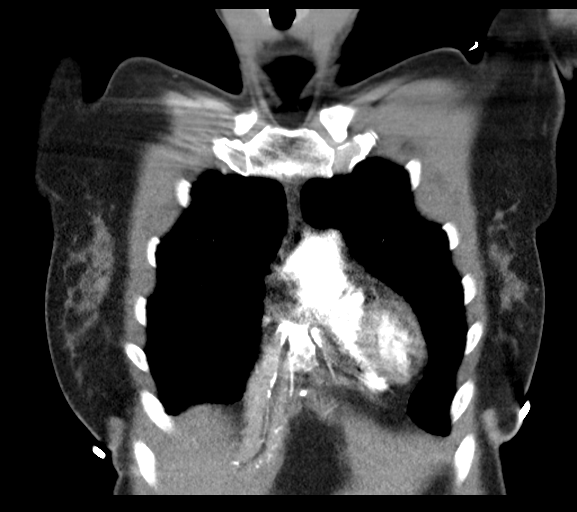
[im 32/64  soft-tissue]
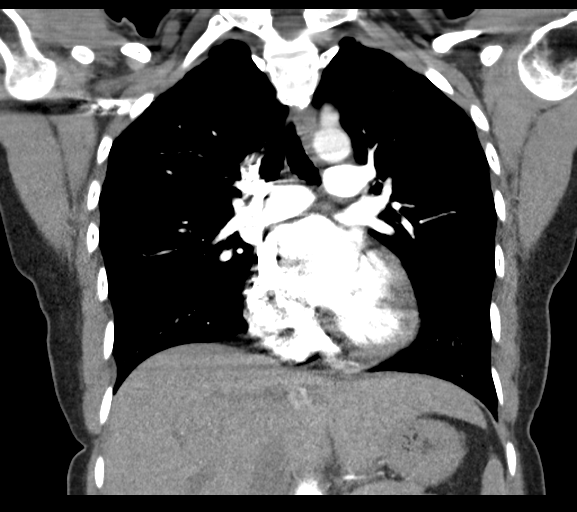
[im 48/64  soft-tissue]
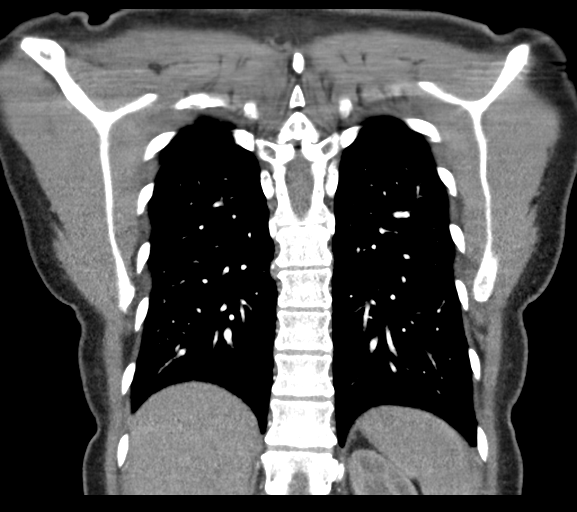

[19 of 46 positions shown; findings below may reference images not displayed]

FINDINGS: Cardiovascular: There is no demonstrable pulmonary embolus. There is
no thoracic aortic aneurysm or dissection. Visualized great vessels
appear normal. There is no appreciable pericardial effusion or
pericardial thickening.

Mediastinum/Nodes: Visualized thyroid appears normal. No evident
thoracic adenopathy. No esophageal lesions are evident.

Lungs/Pleura: The lungs are clear. No pleural effusion. No
pneumothorax. Trachea and major bronchial structures appear patent.

Upper Abdomen: Visualized upper abdominal structures appear
unremarkable.

Musculoskeletal: No blastic or lytic bone lesions. No evident chest
wall lesions.

Review of the MIP images confirms the above findings.
IMPRESSION: 1. No demonstrable pulmonary embolus. No thoracic aortic aneurysm or
dissection.

2.  Lungs clear.

3.  No evident adenopathy.

## 2022-02-01 IMAGING — CR DG CHEST 2V
1 series · 2 of 2 positions shown · non-contrast
Comparison: None.

CLINICAL DATA: 44-year-old female with central chest pain and
pressure since last night. Shortness of breath.

EXAM:
CHEST - 2 VIEW

[Series 1: dg chest 2 view · 0.14mm/px · 2 of 2 slices shown]
[im 1/2]
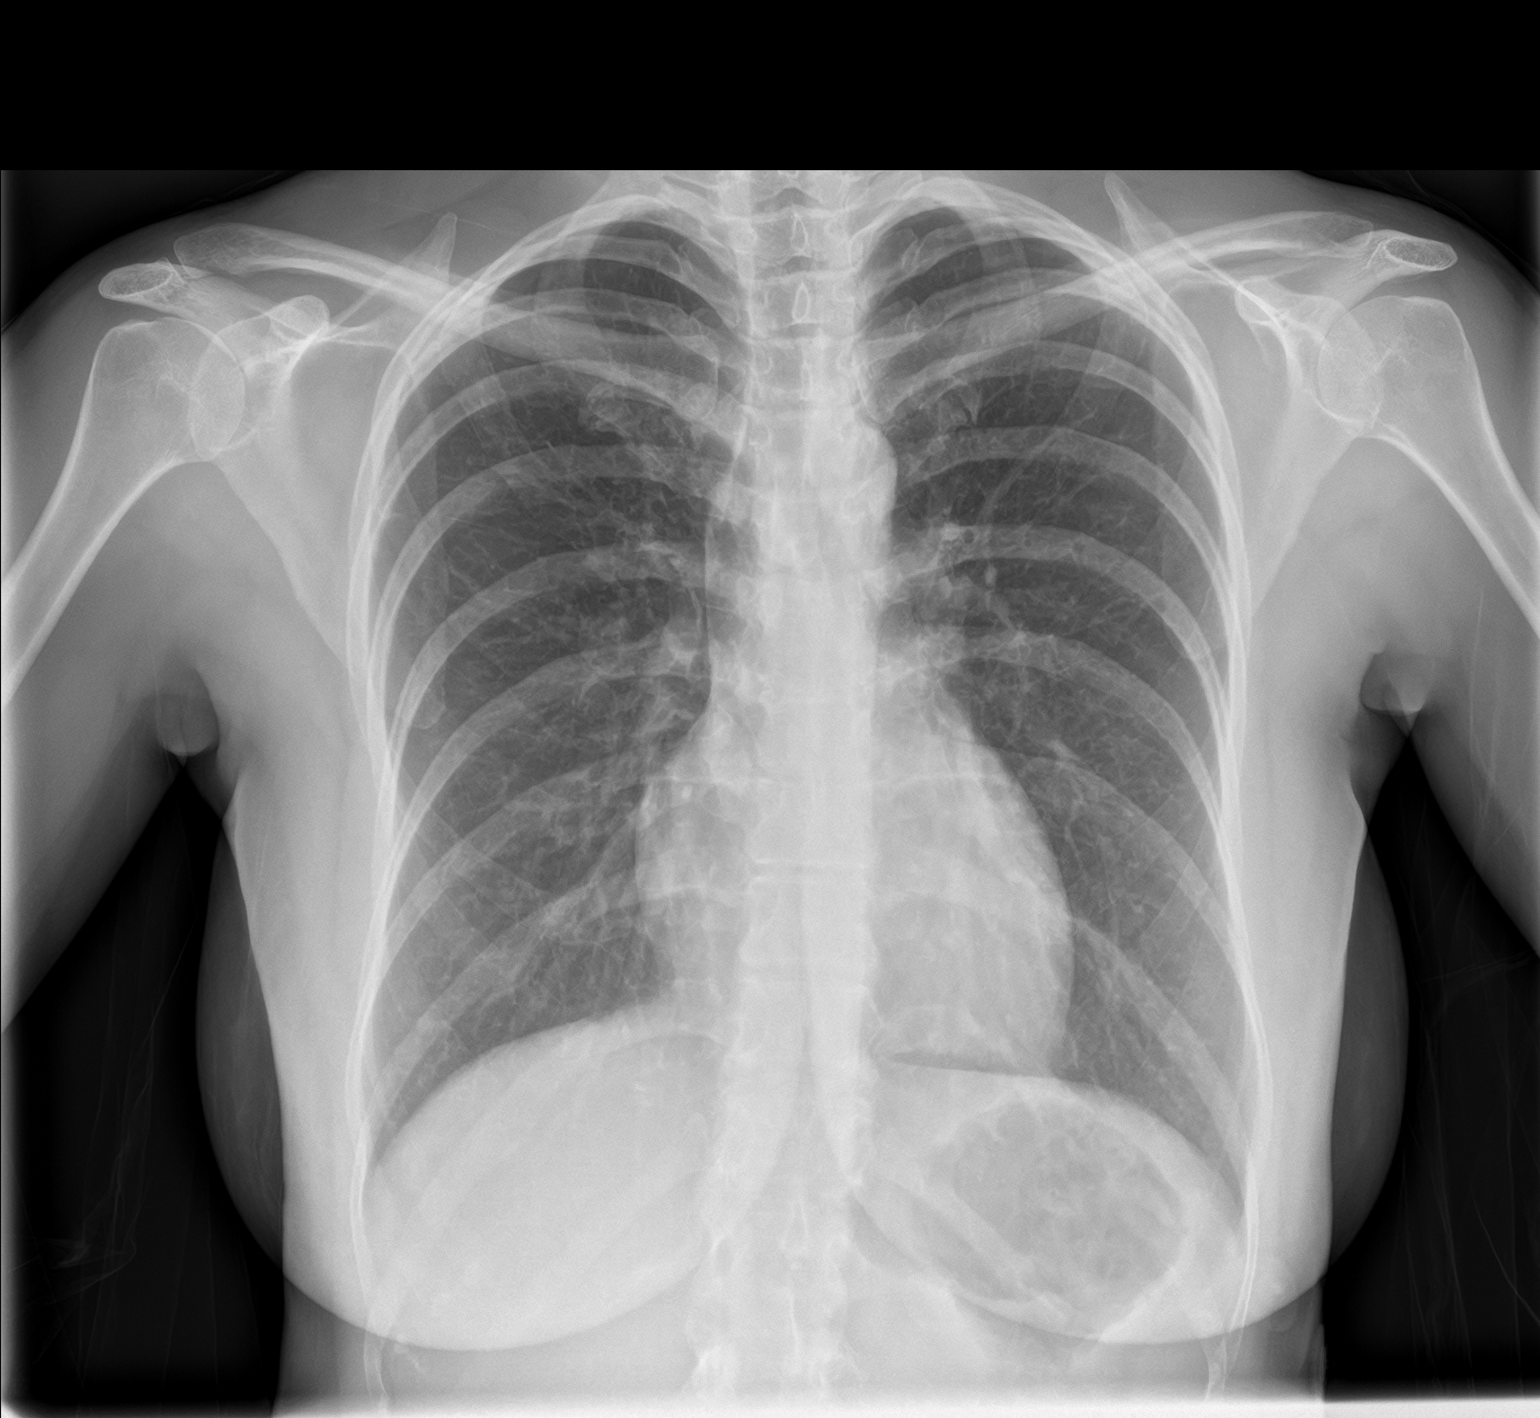
[im 2/2]
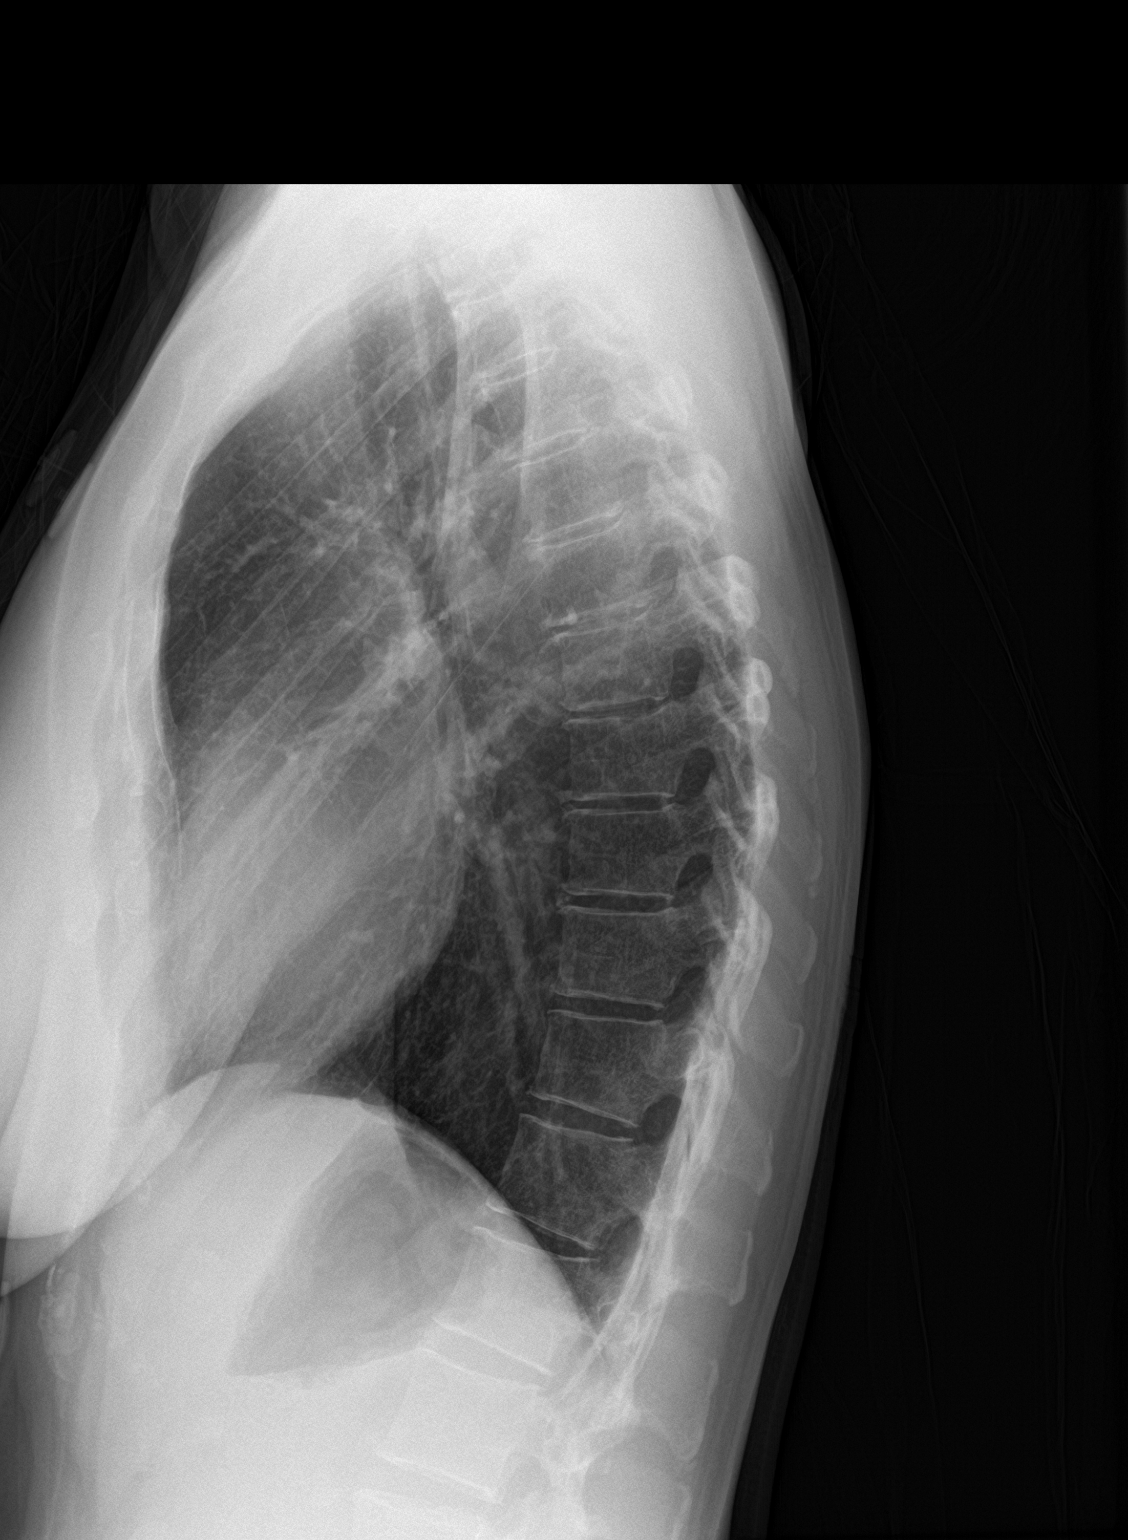

[2 of 2 positions shown; findings below may reference images not displayed]

FINDINGS: The heart size and mediastinal contours are within normal limits.
Both lungs are clear. Normal lung volumes. No pneumothorax or
pleural effusion. Visualized tracheal air column is within normal
limits. Negative visible osseous structures, bowel gas.
IMPRESSION: Negative.  No cardiopulmonary abnormality identified.

## 2022-05-25 ENCOUNTER — Other Ambulatory Visit: Payer: Self-pay | Admitting: Internal Medicine

## 2022-05-25 DIAGNOSIS — Z1231 Encounter for screening mammogram for malignant neoplasm of breast: Secondary | ICD-10-CM

## 2022-07-21 ENCOUNTER — Ambulatory Visit
Admission: RE | Admit: 2022-07-21 | Discharge: 2022-07-21 | Disposition: A | Discharge status: Home / Self Care | Payer: 59 | Source: Ambulatory Visit | Attending: Internal Medicine | Admitting: Internal Medicine

## 2022-07-21 DIAGNOSIS — Z1231 Encounter for screening mammogram for malignant neoplasm of breast: Secondary | ICD-10-CM | POA: Insufficient documentation

## 2022-08-27 ENCOUNTER — Other Ambulatory Visit: Payer: Self-pay | Admitting: Internal Medicine

## 2022-08-27 DIAGNOSIS — R413 Other amnesia: Secondary | ICD-10-CM

## 2022-08-27 DIAGNOSIS — G44229 Chronic tension-type headache, not intractable: Secondary | ICD-10-CM

## 2022-08-31 ENCOUNTER — Encounter (INDEPENDENT_AMBULATORY_CARE_PROVIDER_SITE_OTHER): Payer: Self-pay

## 2022-09-07 ENCOUNTER — Ambulatory Visit
Admission: RE | Admit: 2022-09-07 | Discharge: 2022-09-07 | Disposition: A | Discharge status: Home / Self Care | Payer: 59 | Source: Ambulatory Visit | Attending: Internal Medicine | Admitting: Internal Medicine

## 2022-09-07 DIAGNOSIS — G44229 Chronic tension-type headache, not intractable: Secondary | ICD-10-CM

## 2022-09-07 DIAGNOSIS — R413 Other amnesia: Secondary | ICD-10-CM

## 2023-12-29 ENCOUNTER — Emergency Department: Payer: Medicaid Other

## 2023-12-29 ENCOUNTER — Emergency Department
Admission: EM | Admit: 2023-12-29 | Discharge: 2023-12-29 | Disposition: A | Discharge status: Home / Self Care | Payer: Medicaid Other | Attending: Emergency Medicine | Admitting: Emergency Medicine

## 2023-12-29 DIAGNOSIS — J101 Influenza due to other identified influenza virus with other respiratory manifestations: Secondary | ICD-10-CM | POA: Insufficient documentation

## 2023-12-29 DIAGNOSIS — R0602 Shortness of breath: Secondary | ICD-10-CM | POA: Diagnosis present

## 2023-12-29 LAB — CBC
HCT: 30 % — ABNORMAL LOW (ref 36.0–46.0)
Hemoglobin: 9.8 g/dL — ABNORMAL LOW (ref 12.0–15.0)
MCH: 27.5 pg (ref 26.0–34.0)
MCHC: 32.7 g/dL (ref 30.0–36.0)
MCV: 84 fL (ref 80.0–100.0)
Platelets: 276 10*3/uL (ref 150–400)
RBC: 3.57 MIL/uL — ABNORMAL LOW (ref 3.87–5.11)
RDW: 12.8 % (ref 11.5–15.5)
WBC: 4.2 10*3/uL (ref 4.0–10.5)
nRBC: 0 % (ref 0.0–0.2)

## 2023-12-29 LAB — COMPREHENSIVE METABOLIC PANEL
ALT: 13 U/L (ref 0–44)
AST: 18 U/L (ref 15–41)
Albumin: 3.8 g/dL (ref 3.5–5.0)
Alkaline Phosphatase: 40 U/L (ref 38–126)
Anion gap: 9 (ref 5–15)
BUN: 7 mg/dL (ref 6–20)
CO2: 23 mmol/L (ref 22–32)
Calcium: 8.6 mg/dL — ABNORMAL LOW (ref 8.9–10.3)
Chloride: 104 mmol/L (ref 98–111)
Creatinine, Ser: 0.72 mg/dL (ref 0.44–1.00)
GFR, Estimated: >60 mL/min (ref >60)
Glucose, Bld: 95 mg/dL (ref 70–99)
Potassium: 3.1 mmol/L — ABNORMAL LOW (ref 3.5–5.1)
Sodium: 136 mmol/L (ref 135–145)
Total Bilirubin: 0.3 mg/dL (ref 0.0–1.2)
Total Protein: 6.8 g/dL (ref 6.5–8.1)

## 2023-12-29 LAB — RESP PANEL BY RT-PCR (RSV, FLU A&B, COVID)  RVPGX2
Influenza A by PCR: POSITIVE — AB
Influenza B by PCR: NEGATIVE
Resp Syncytial Virus by PCR: NEGATIVE
SARS Coronavirus 2 by RT PCR: NEGATIVE

## 2023-12-29 NOTE — ED Provider Notes (Signed)
 [facility] Provider Note    Event Date/Time   First MD Initiated Contact with Patient 12/29/23 1601     (approximate)   History   Shortness of Breath and Weakness   HPI  Lauren Barron is a 48 y.o. female who presents to the ED for evaluation of Shortness of Breath and Weakness   Review of PCP visit from 1 month ago history of chronic fatigue, depression anxiety, palpitations  Patient presents with a female friend due to feeling unwell for the past 2 or 3 days.  Generalized malaise, poor appetite, cough and shortness of breath, fever up to 102 F treated with Tylenol.  Subacute menstrual bleeding and she is perimenopausal without abdominal pain.  No syncope or falls.  No chest pain.   Physical Exam   Triage Vital Signs: ED Triage Vitals  Encounter Vitals Group     BP 12/29/23 1348 106/65     Systolic BP Percentile --      Diastolic BP Percentile --      Pulse Rate 12/29/23 1348 (!) 108     Resp --      Temp 12/29/23 1348 98.9 F (37.2 C)     Temp Source 12/29/23 1348 Oral     SpO2 12/29/23 1348 100 %     Weight --      Height 12/29/23 1342 5\' 4"  (1.626 m)     Head Circumference --      Peak Flow --      Pain Score 12/29/23 1342 7     Pain Loc --      Pain Education --      Exclude from Growth Chart --     Most recent vital signs: Vitals:   12/29/23 1348  BP: 106/65  Pulse: (!) 108  Temp: 98.9 F (37.2 C)  SpO2: 100%    General: Awake, no distress.  Seems mildly uncomfortable but looks well CV:  Good peripheral perfusion.  Resp:  Normal effort.  Clear without wheezing, tachypnea or distress Abd:  No distention.  Soft and benign MSK:  No deformity noted.  Neuro:  No focal deficits appreciated. Other:     ED Results / Procedures / Treatments   Labs (all labs ordered are listed, but only abnormal results are displayed) Labs Reviewed  RESP PANEL BY RT-PCR (RSV, FLU A&B, COVID)  RVPGX2 - Abnormal; Notable for the following  components:      Result Value   Influenza A by PCR POSITIVE (*)    All other components within normal limits  CBC - Abnormal; Notable for the following components:   RBC 3.57 (*)    Hemoglobin 9.8 (*)    HCT 30.0 (*)    All other components within normal limits  COMPREHENSIVE METABOLIC PANEL - Abnormal; Notable for the following components:   Potassium 3.1 (*)    Calcium 8.6 (*)    All other components within normal limits    EKG Sinus rhythm with a rate of 98 bpm.  Normal axis and intervals.  No clear signs of acute ischemia.  RADIOLOGY CXR interpreted by me without evidence of acute cardiopulmonary pathology.  Official radiology report(s): DG Chest 2 View Result Date: 12/29/2023 CLINICAL DATA:  Cough, fever, and shortness of breath. EXAM: CHEST - 2 VIEW COMPARISON:  CT chest and chest x-ray dated November 26, 2020. FINDINGS: The heart size and mediastinal contours are within normal limits. Both lungs are clear. The visualized skeletal structures are unremarkable.  IMPRESSION: No active cardiopulmonary disease. Electronically Signed   By: Obie Dredge M.D.   On: 12/29/2023 15:32    PROCEDURES and INTERVENTIONS:  Procedures  Medications - No data to display   IMPRESSION / MDM / ASSESSMENT AND PLAN / ED COURSE  I reviewed the triage vital signs and the nursing notes.  Differential diagnosis includes, but is not limited to, ACS, pneumonia, symptomatic anemia, viral syndrome  {Patient presents with symptoms of an acute illness or injury that is potentially life-threatening.  Patient presents with signs of influenza suitable for outpatient management.  Presenting tachycardia is noted.  Influenza positive.  Mild normocytic anemia likely from blood loss from perimenopausal bleeding.  Mild hypokalemia.  Clear CXR.  Suitable for outpatient management.  Discussed OTC antipyretics, fluids, rest and other supportive measures.  Discussed ED return precautions.      FINAL CLINICAL  IMPRESSION(S) / ED DIAGNOSES   Final diagnoses:  Influenza A     Rx / DC Orders   ED Discharge Orders     None        Note:  This document was prepared using Dragon voice recognition software and may include unintentional dictation errors.   Delton [place], MD 12/29/23 [phone]

## 2023-12-29 NOTE — Discharge Instructions (Signed)
 Please take Tylenol and ibuprofen/Advil for your pain.  It is safe to take them together, or to alternate them every few hours.  Take up to 1000mg  of Tylenol at a time, up to 4 times per day.  Do not take more than 4000 mg of Tylenol in 24 hours.  For ibuprofen, take 400-600 mg, 3 - 4 times per day.  Plenty of fluids and rest  Return to the ED with any worsening symptoms despite these measures  Consider trying anti-inflammatory medicine such as naproxen/Aleve or ibuprofen/Advil.  If you develop any hives, red rash that itches, use Benadryl to treat this

## 2023-12-29 NOTE — ED Triage Notes (Signed)
 Pt here with weakness, SOB, cough, and fever. Pt also states she has had her menstrual for about 4 weeks as well. Pt states she has been light headed at times. Pt stable in triage.

## 2024-01-07 ENCOUNTER — Inpatient Hospital Stay: Attending: Internal Medicine | Admitting: Internal Medicine

## 2024-01-07 ENCOUNTER — Encounter: Payer: Self-pay | Admitting: Internal Medicine

## 2024-01-07 ENCOUNTER — Inpatient Hospital Stay

## 2024-01-07 VITALS — BP 105/71 | HR 85 | Temp 96.9°F | Resp 16 | Ht 64.0 in | Wt 128.4 lb

## 2024-01-07 DIAGNOSIS — N939 Abnormal uterine and vaginal bleeding, unspecified: Secondary | ICD-10-CM | POA: Diagnosis not present

## 2024-01-07 DIAGNOSIS — D649 Anemia, unspecified: Secondary | ICD-10-CM | POA: Diagnosis present

## 2024-01-07 DIAGNOSIS — Z79899 Other long term (current) drug therapy: Secondary | ICD-10-CM | POA: Insufficient documentation

## 2024-01-07 DIAGNOSIS — F32A Depression, unspecified: Secondary | ICD-10-CM | POA: Diagnosis not present

## 2024-01-07 NOTE — Progress Notes (Signed)
 [facility] CONSULT NOTE  Patient Care Team: [facility] as PCP - General Donneta Romberg, Worthy Flank, MD as Consulting Physician (Oncology)  CHIEF COMPLAINTS/PURPOSE OF CONSULTATION: ANEMIA   HEMATOLOGY HISTORY  # ANEMIA[Hb; MCV-platelets- WBC; Iron sat; ferritin;  GFR- CT/US- ;   HISTORY OF PRESENTING ILLNESS:  Lauren Barron 48 y.o.  female pleasant patient with a history of heavy menstrual cycles been referred to [place] for further evaluation of anemia.  Patient complains of shortness of breath with exertion.  Also complains of excessive fatigue. Notes to palpitations.  Complains of dizziness especially on standing.  Pica:-none.   Of note patient complains of mild tenderness in the left neck especially swallowing.   Recent flu.  However no worsening chest pain.  Blood in stools: none- EGD/colonoscopy-cancelled sec flu- awaiting to reschedule.  Blood in urine: none Difficulty swallowing: Change of bowel movement/constipation:none Prior blood transfusion:none Kidney/Liver disease:none Alcohol: none Bariatric surgery:none   Vaginal bleeding: heavy  Prior evaluation with hematology:none Prior bone marrow biopsy: none Oral iron: none Prior IV iron infusions: none.    Review of Systems  Constitutional:  Positive for malaise/fatigue. Negative for chills, diaphoresis, fever and weight loss.  HENT:  Negative for nosebleeds and sore throat.   Eyes:  Negative for double vision.  Respiratory:  Negative for cough, hemoptysis, sputum production, shortness of breath and wheezing.   Cardiovascular:  Negative for chest pain, palpitations, orthopnea and leg swelling.  Gastrointestinal:  Negative for abdominal pain, blood in stool, constipation, diarrhea, heartburn, melena, nausea and vomiting.  Genitourinary:  Negative for dysuria, frequency and urgency.  Musculoskeletal:  Negative for back pain and joint pain.  Skin: Negative.  Negative for itching and rash.   Neurological:  Positive for dizziness. Negative for tingling, focal weakness, weakness and headaches.  Endo/Heme/Allergies:  Does not bruise/bleed easily.  Psychiatric/Behavioral:  Negative for depression. The patient is not nervous/anxious and does not have insomnia.      MEDICAL HISTORY:  History reviewed. No pertinent past medical history.  SURGICAL HISTORY: Past Surgical History:  Procedure Laterality Date   CESAREAN SECTION      SOCIAL HISTORY: Social History   Socioeconomic History   Marital status: Divorced    Spouse name: Not on file   Number of children: Not on file   Years of education: Not on file   Highest education level: Not on file  Occupational History   Not on file  Tobacco Use   Smoking status: Never   Smokeless tobacco: Never  Vaping Use   Vaping status: Never Used  Substance and Sexual Activity   Alcohol use: No   Drug use: No   Sexual activity: Not on file  Other Topics Concern   Not on file  Social History Narrative   Not on file   Social Drivers of Health   Financial Resource Strain: Low Risk  (01/04/2024)   Received from [facility]   Overall Financial Resource Strain (CARDIA)    Difficulty of Paying Living Expenses: Not hard at all  Recent Concern: Financial Resource Strain - High Risk (11/18/2023)   Received from [facility]   Overall Financial Resource Strain (CARDIA)    Difficulty of Paying Living Expenses: Hard  Food Insecurity: Food Insecurity Present (01/07/2024)   Hunger Vital Sign    Worried About Running Out of Food in the Last Year: Sometimes true    Ran Out of Food in the Last Year: Never true  Transportation Needs:  No Transportation Needs (01/07/2024)   PRAPARE - Administrator, Civil Service (Medical): No    Lack of Transportation (Non-Medical): No  Physical Activity: Not on file  Stress: Not on file  Social Connections: Not on file  Intimate Partner Violence: Not At Risk  (01/07/2024)   Humiliation, Afraid, Rape, and Kick questionnaire    Fear of Current or Ex-Partner: No    Emotionally Abused: No    Physically Abused: No    Sexually Abused: No    FAMILY HISTORY: Family History  Problem Relation Age of Onset   Hypertension Mother    Diabetes Father     ALLERGIES:  is allergic to aspirin and ketorolac tromethamine.  MEDICATIONS:  Current Outpatient Medications  Medication Sig Dispense Refill   albuterol (VENTOLIN HFA) 108 (90 Base) MCG/ACT inhaler SMARTSIG:2 Inhalation Via Inhaler Every 6 Hours PRN     buPROPion (WELLBUTRIN XL) 150 MG 24 hr tablet Take 1 tablet by mouth daily.     gabapentin (NEURONTIN) 100 MG capsule Take 100 mg by mouth 2 (two) times daily.     metoprolol tartrate (LOPRESSOR) 25 MG tablet Take 12.5 mg by mouth 2 (two) times daily.     No current facility-administered medications for this visit.     PHYSICAL EXAMINATION:   Vitals:   01/07/24 1053  BP: 105/71  Pulse: 85  Resp: 16  Temp: (!) 96.9 F (36.1 C)  SpO2: 100%   Filed Weights   01/07/24 1053  Weight: 128 lb 6.4 oz (58.2 kg)   Left neck mild tenderness 1 cm lymph nodes of the mandible.  Oral mucosa injected.  There is no exudates.  Physical Exam Vitals and nursing note reviewed.  HENT:     Head: Normocephalic and atraumatic.     Mouth/Throat:     Pharynx: Oropharynx is clear.  Eyes:     Extraocular Movements: Extraocular movements intact.     Pupils: Pupils are equal, round, and reactive to light.  Cardiovascular:     Rate and Rhythm: Normal rate and regular rhythm.  Pulmonary:     Comments: Decreased breath sounds bilaterally.  Abdominal:     Palpations: Abdomen is soft.  Musculoskeletal:        General: Normal range of motion.     Cervical back: Normal range of motion.  Skin:    General: Skin is warm.  Neurological:     General: No focal deficit present.     Mental Status: She is alert and oriented to person, place, and time.  Psychiatric:         Behavior: Behavior normal.        Judgment: Judgment normal.      LABORATORY DATA:  I have reviewed the data as listed Lab Results  Component Value Date   WBC 4.2 12/29/2023   HGB 9.8 (L) 12/29/2023   HCT 30.0 (L) 12/29/2023   MCV 84.0 12/29/2023   PLT 276 12/29/2023   Recent Labs    12/29/23 1344  NA 136  K 3.1*  CL 104  CO2 23  GLUCOSE 95  BUN 7  CREATININE 0.72  CALCIUM 8.6*  GFRNONAA >60  PROT 6.8  ALBUMIN 3.8  AST 18  ALT 13  ALKPHOS 40  BILITOT 0.3     DG Chest 2 View Result Date: 12/29/2023 CLINICAL DATA:  Cough, fever, and shortness of breath. EXAM: CHEST - 2 VIEW COMPARISON:  CT chest and chest x-ray dated November 26, 2020. FINDINGS: The heart  size and mediastinal contours are within normal limits. Both lungs are clear. The visualized skeletal structures are unremarkable. IMPRESSION: No active cardiopulmonary disease. Electronically Signed   By: Obie Dredge M.D.   On: 12/29/2023 15:32    ASSESSMENT & PLAN:   Symptomatic anemia # Anemia--symptomatic.  Likely due to iron deficiency - from etiology  menorrhagia.MARCH 2025- [[facility]]-hemoglobin 8.1.  Ferritin 5.   Discussed regarding IV iron infusion/Venofer. Discussed the potential acute infusion reactions with IV iron; which are quite rare.  Patient understands the risk; will proceed with infusions.  Consider oral iron post IV iron infusions.  #Etiology of iron deficiency: likley from mentrsual cycles [ awaiting evaluation [facility] next week. ]; I awaiting GI evaluation- screening colonoscopy-HOLD off urine pregnancy test- discussed with pt. [Tubal ligatuon 17 years ago].   # URI/cough/recent Flu-left-sided neck mild tenderness-small lymph node-no concern for any malignancy.  Recommend baking soda and salt water rinses.  If not improved defer to PCP.  # Depression: Bupropion/Neurontin for pains  Thank you Ms.Field NP . for allowing me to participate in the care of your pleasant patient. Please do not  hesitate to contact me with questions or concerns in the interim.   NO-urine pregnancy test- [Tubal ligatuon 17 years ago] # DISPOSITION: #  NO labs today # weekly 2 times a week- start next week # also- 2 more infusions that following week.  # follow up 6- 8 weeks- MD; labs- cbc/bmp;LDH-  possible venofer- Dr.B    All questions were answered. The patient knows to call the clinic with any problems, questions or concerns.    Earna Coder, MD 01/07/2024 12:43 PM

## 2024-01-07 NOTE — Assessment & Plan Note (Addendum)
#   Anemia--symptomatic.  Likely due to iron deficiency - from etiology  menorrhagia.MARCH 2025- [[facility]]-hemoglobin 8.1.  Ferritin 5.   Discussed regarding IV iron infusion/Venofer. Discussed the potential acute infusion reactions with IV iron; which are quite rare.  Patient understands the risk; will proceed with infusions.  Consider oral iron post IV iron infusions.  #Etiology of iron deficiency: likley from mentrsual cycles [ awaiting evaluation [facility] next week. ]; I awaiting GI evaluation- screening colonoscopy-HOLD off urine pregnancy test- discussed with pt. [Tubal ligatuon 17 years ago].   # URI/cough/recent Flu-left-sided neck mild tenderness-small lymph node-no concern for any malignancy.  Recommend baking soda and salt water rinses.  If not improved defer to PCP.  # Depression: Bupropion/Neurontin for pains  Thank you Ms.Field NP . for allowing me to participate in the care of your pleasant patient. Please do not hesitate to contact me with questions or concerns in the interim.   NO-urine pregnancy test- [Tubal ligatuon 17 years ago] # DISPOSITION: #  NO labs today # weekly 2 times a week- start next week # also- 2 more infusions that following week.  # follow up 6- 8 weeks- MD; labs- cbc/bmp;LDH-  possible venofer- Dr.B

## 2024-01-07 NOTE — Progress Notes (Signed)
 Fatigue/weakness: YES Dyspena: YES Light headedness: YES OCCASIONALLY Blood in stool: NO  C/o heavy menstrual cycle, she bled from [place] 28-YESTERDAY.

## 2024-01-10 ENCOUNTER — Inpatient Hospital Stay

## 2024-01-10 VITALS — BP 100/65 | HR 82 | Temp 97.2°F | Resp 16

## 2024-01-10 DIAGNOSIS — D649 Anemia, unspecified: Secondary | ICD-10-CM

## 2024-01-10 MED ORDER — IRON SUCROSE 20 MG/ML IV SOLN
200.0000 mg | Freq: Once | INTRAVENOUS | Status: AC
Start: 2024-01-10 — End: 2024-01-10
  Administered 2024-01-10: 200 mg via INTRAVENOUS
  Filled 2024-01-10: qty 10

## 2024-01-10 MED ORDER — SODIUM CHLORIDE 0.9% FLUSH
10.0000 mL | Freq: Once | INTRAVENOUS | Status: AC | PRN
  Administered 2024-01-10: 10 mL
  Filled 2024-01-10: qty 10

## 2024-01-13 ENCOUNTER — Inpatient Hospital Stay

## 2024-01-13 VITALS — BP 108/65 | HR 83 | Temp 97.7°F | Resp 16

## 2024-01-13 DIAGNOSIS — D649 Anemia, unspecified: Secondary | ICD-10-CM

## 2024-01-13 MED ORDER — IRON SUCROSE 20 MG/ML IV SOLN
200.0000 mg | Freq: Once | INTRAVENOUS | Status: AC
Start: 2024-01-13 — End: 2024-01-13
  Administered 2024-01-13: 200 mg via INTRAVENOUS
  Filled 2024-01-13: qty 10

## 2024-01-13 MED ORDER — SODIUM CHLORIDE 0.9% FLUSH
10.0000 mL | Freq: Once | INTRAVENOUS | Status: AC | PRN
  Administered 2024-01-13: 10 mL
  Filled 2024-01-13: qty 10

## 2024-01-13 NOTE — Patient Instructions (Signed)

## 2024-01-17 ENCOUNTER — Inpatient Hospital Stay

## 2024-01-17 VITALS — BP 104/72 | HR 92 | Temp 97.9°F | Resp 16

## 2024-01-17 DIAGNOSIS — D649 Anemia, unspecified: Secondary | ICD-10-CM | POA: Diagnosis not present

## 2024-01-17 MED ORDER — SODIUM CHLORIDE 0.9% FLUSH
10.0000 mL | Freq: Once | INTRAVENOUS | Status: AC | PRN
  Administered 2024-01-17: 10 mL
  Filled 2024-01-17: qty 10

## 2024-01-17 MED ORDER — IRON SUCROSE 20 MG/ML IV SOLN
200.0000 mg | Freq: Once | INTRAVENOUS | Status: AC
  Administered 2024-01-17: 200 mg via INTRAVENOUS
  Filled 2024-01-17: qty 10

## 2024-01-17 NOTE — Patient Instructions (Signed)

## 2024-01-17 NOTE — Progress Notes (Signed)
 Declined post-observation. Aware of risks. Vitals stable at discharge.

## 2024-01-20 ENCOUNTER — Inpatient Hospital Stay

## 2024-01-27 ENCOUNTER — Inpatient Hospital Stay

## 2024-01-27 VITALS — BP 103/61 | HR 73 | Temp 98.6°F | Resp 19

## 2024-01-27 DIAGNOSIS — D649 Anemia, unspecified: Secondary | ICD-10-CM

## 2024-01-27 MED ORDER — SODIUM CHLORIDE 0.9% FLUSH
10.0000 mL | Freq: Once | INTRAVENOUS | Status: AC | PRN
  Administered 2024-01-27: 10 mL
  Filled 2024-01-27: qty 10

## 2024-01-27 MED ORDER — IRON SUCROSE 20 MG/ML IV SOLN
200.0000 mg | Freq: Once | INTRAVENOUS | Status: AC
  Administered 2024-01-27: 200 mg via INTRAVENOUS

## 2024-02-21 ENCOUNTER — Other Ambulatory Visit

## 2024-02-21 ENCOUNTER — Ambulatory Visit

## 2024-02-21 ENCOUNTER — Ambulatory Visit: Admitting: Internal Medicine

## 2024-02-25 ENCOUNTER — Inpatient Hospital Stay

## 2024-02-25 ENCOUNTER — Encounter: Payer: Self-pay | Admitting: Internal Medicine

## 2024-02-25 ENCOUNTER — Inpatient Hospital Stay: Attending: Internal Medicine

## 2024-02-25 ENCOUNTER — Inpatient Hospital Stay: Admitting: Internal Medicine

## 2024-02-25 VITALS — BP 106/70 | HR 82 | Temp 98.4°F | Resp 18 | Wt 128.0 lb

## 2024-02-25 VITALS — BP 106/75 | HR 73

## 2024-02-25 DIAGNOSIS — D649 Anemia, unspecified: Secondary | ICD-10-CM

## 2024-02-25 DIAGNOSIS — D509 Iron deficiency anemia, unspecified: Secondary | ICD-10-CM | POA: Diagnosis present

## 2024-02-25 LAB — CBC WITH DIFFERENTIAL (CANCER CENTER ONLY)
Abs Immature Granulocytes: 0.01 10*3/uL (ref 0.00–0.07)
Basophils Absolute: 0 10*3/uL (ref 0.0–0.1)
Basophils Relative: 1 %
Eosinophils Absolute: 0.2 10*3/uL (ref 0.0–0.5)
Eosinophils Relative: 5 %
HCT: 37.6 % (ref 36.0–46.0)
Hemoglobin: 12.2 g/dL (ref 12.0–15.0)
Immature Granulocytes: 0 %
Lymphocytes Relative: 44 %
Lymphs Abs: 1.7 10*3/uL (ref 0.7–4.0)
MCH: 27.9 pg (ref 26.0–34.0)
MCHC: 32.4 g/dL (ref 30.0–36.0)
MCV: 85.8 fL (ref 80.0–100.0)
Monocytes Absolute: 0.3 10*3/uL (ref 0.1–1.0)
Monocytes Relative: 9 %
Neutro Abs: 1.6 10*3/uL — ABNORMAL LOW (ref 1.7–7.7)
Neutrophils Relative %: 41 %
Platelet Count: 257 10*3/uL (ref 150–400)
RBC: 4.38 MIL/uL (ref 3.87–5.11)
RDW: 16.7 % — ABNORMAL HIGH (ref 11.5–15.5)
WBC Count: 3.8 10*3/uL — ABNORMAL LOW (ref 4.0–10.5)
nRBC: 0 % (ref 0.0–0.2)

## 2024-02-25 LAB — BASIC METABOLIC PANEL WITH GFR
Anion gap: 6 (ref 5–15)
BUN: 15 mg/dL (ref 6–20)
CO2: 26 mmol/L (ref 22–32)
Calcium: 9.1 mg/dL (ref 8.9–10.3)
Chloride: 103 mmol/L (ref 98–111)
Creatinine, Ser: 0.6 mg/dL (ref 0.44–1.00)
GFR, Estimated: >60 mL/min (ref >60)
Glucose, Bld: 92 mg/dL (ref 70–99)
Potassium: 4.4 mmol/L (ref 3.5–5.1)
Sodium: 135 mmol/L (ref 135–145)

## 2024-02-25 LAB — LACTATE DEHYDROGENASE: LDH: 111 U/L (ref 98–192)

## 2024-02-25 MED ORDER — SODIUM CHLORIDE 0.9% FLUSH
10.0000 mL | Freq: Once | INTRAVENOUS | Status: AC | PRN
Start: 2024-02-25 — End: 2024-02-25
  Administered 2024-02-25: 10 mL
  Filled 2024-02-25: qty 10

## 2024-02-25 MED ORDER — IRON SUCROSE 20 MG/ML IV SOLN
200.0000 mg | Freq: Once | INTRAVENOUS | Status: AC
  Administered 2024-02-25: 200 mg via INTRAVENOUS
  Filled 2024-02-25: qty 10

## 2024-02-25 NOTE — Patient Instructions (Signed)
#  Recommend gentle iron [iron biglycinate; 28 mg ] 1 pill a day.  This pill is unlikely to cause stomach upset or cause constipation.

## 2024-02-25 NOTE — Progress Notes (Signed)
 [facility] CONSULT NOTE  Patient Care Team: [facility] as PCP - General Valentine Gasmen, Donnald Fuss, MD as Consulting Physician (Oncology)  CHIEF COMPLAINTS/PURPOSE OF CONSULTATION: ANEMIA   HEMATOLOGY HISTORY  # ANEMIA[Hb; MCV-platelets- WBC; Iron  sat; ferritin;  GFR- CT/US - ;   HISTORY OF PRESENTING ILLNESS:  Lauren Barron 48 y.o.  female pleasant patient with a history of heavy menstrual cycles /iron  def anemia is here for a follow up.  Patient here for follow up. Patient denies any concerns at this time   S/p venofer  x 4-. Clinically improved.   Review of Systems  Constitutional:  Negative for chills, diaphoresis, fever and weight loss.  HENT:  Negative for nosebleeds and sore throat.   Eyes:  Negative for double vision.  Respiratory:  Negative for cough, hemoptysis, sputum production, shortness of breath and wheezing.   Cardiovascular:  Negative for chest pain, palpitations, orthopnea and leg swelling.  Gastrointestinal:  Negative for abdominal pain, blood in stool, constipation, diarrhea, heartburn, melena, nausea and vomiting.  Genitourinary:  Negative for dysuria, frequency and urgency.  Musculoskeletal:  Negative for back pain and joint pain.  Skin: Negative.  Negative for itching and rash.  Neurological:  Negative for tingling, focal weakness, weakness and headaches.  Endo/Heme/Allergies:  Does not bruise/bleed easily.  Psychiatric/Behavioral:  Negative for depression. The patient is not nervous/anxious and does not have insomnia.      MEDICAL HISTORY:  History reviewed. No pertinent past medical history.  SURGICAL HISTORY: Past Surgical History:  Procedure Laterality Date   CESAREAN SECTION      SOCIAL HISTORY: Social History   Socioeconomic History   Marital status: Divorced    Spouse name: Not on file   Number of children: Not on file   Years of education: Not on file   Highest education level: Not on file  Occupational  History   Not on file  Tobacco Use   Smoking status: Never   Smokeless tobacco: Never  Vaping Use   Vaping status: Never Used  Substance and Sexual Activity   Alcohol use: No   Drug use: No   Sexual activity: Not on file  Other Topics Concern   Not on file  Social History Narrative   Not on file   Social Drivers of Health   Financial Resource Strain: Low Risk  (01/04/2024)   Received from [facility]   Overall Financial Resource Strain (CARDIA)    Difficulty of Paying Living Expenses: Not hard at all  Recent Concern: Financial Resource Strain - High Risk (11/18/2023)   Received from [facility]   Overall Financial Resource Strain (CARDIA)    Difficulty of Paying Living Expenses: Hard  Food Insecurity: Food Insecurity Present (01/07/2024)   Hunger Vital Sign    Worried About Running Out of Food in the Last Year: Sometimes true    Ran Out of Food in the Last Year: Never true  Transportation Needs: No Transportation Needs (01/07/2024)   PRAPARE - Administrator, Civil Service (Medical): No    Lack of Transportation (Non-Medical): No  Physical Activity: Not on file  Stress: Not on file  Social Connections: Not on file  Intimate Partner Violence: Not At Risk (01/07/2024)   Humiliation, Afraid, Rape, and Kick questionnaire    Fear of Current or Ex-Partner: No    Emotionally Abused: No    Physically Abused: No    Sexually Abused: No    FAMILY HISTORY: Family History  Problem Relation Age of Onset   Hypertension Mother    Diabetes Father     ALLERGIES:  is allergic to aspirin and ketorolac tromethamine.  MEDICATIONS:  Current Outpatient Medications  Medication Sig Dispense Refill   albuterol (VENTOLIN HFA) 108 (90 Base) MCG/ACT inhaler SMARTSIG:2 Inhalation Via Inhaler Every 6 Hours PRN     buPROPion (WELLBUTRIN XL) 150 MG 24 hr tablet Take 1 tablet by mouth daily.     gabapentin (NEURONTIN) 100 MG capsule Take 100 mg by mouth 2  (two) times daily.     metoprolol tartrate (LOPRESSOR) 25 MG tablet Take 12.5 mg by mouth 2 (two) times daily.     No current facility-administered medications for this visit.   Facility-Administered Medications Ordered in Other Visits  Medication Dose Route Frequency Provider Last Rate Last Admin   iron  sucrose (VENOFER ) injection 200 mg  200 mg Intravenous Once Jhania Etherington R, MD       sodium chloride  flush (NS) 0.9 % injection 10 mL  10 mL Intracatheter Once PRN Alcide Memoli R, MD         PHYSICAL EXAMINATION:   Vitals:   02/25/24 1431  BP: 106/70  Pulse: 82  Resp: 18  Temp: 98.4 F (36.9 C)  SpO2: 100%   Filed Weights   02/25/24 1431  Weight: 128 lb (58.1 kg)   Left neck mild tenderness 1 cm lymph nodes of the mandible.  Oral mucosa injected.  There is no exudates.  Physical Exam Vitals and nursing note reviewed.  HENT:     Head: Normocephalic and atraumatic.     Mouth/Throat:     Pharynx: Oropharynx is clear.  Eyes:     Extraocular Movements: Extraocular movements intact.     Pupils: Pupils are equal, round, and reactive to light.  Cardiovascular:     Rate and Rhythm: Normal rate and regular rhythm.  Pulmonary:     Comments: Decreased breath sounds bilaterally.  Abdominal:     Palpations: Abdomen is soft.  Musculoskeletal:        General: Normal range of motion.     Cervical back: Normal range of motion.  Skin:    General: Skin is warm.  Neurological:     General: No focal deficit present.     Mental Status: She is alert and oriented to person, place, and time.  Psychiatric:        Behavior: Behavior normal.        Judgment: Judgment normal.      LABORATORY DATA:  I have reviewed the data as listed Lab Results  Component Value Date   WBC 3.8 (L) 02/25/2024   HGB 12.2 02/25/2024   HCT 37.6 02/25/2024   MCV 85.8 02/25/2024   PLT 257 02/25/2024   Recent Labs    12/29/23 1344 02/25/24 1422  NA 136 135  K 3.1* 4.4  CL 104 103   CO2 23 26  GLUCOSE 95 92  BUN 7 15  CREATININE 0.72 0.60  CALCIUM 8.6* 9.1  GFRNONAA >60 >60  PROT 6.8  --   ALBUMIN 3.8  --   AST 18  --   ALT 13  --   ALKPHOS 40  --   BILITOT 0.3  --     No results found.   ASSESSMENT & PLAN:   Symptomatic anemia #  Anemia--symptomatic.  Likely due to iron  deficiency - from etiology  menorrhagia.MARCH 2025- [[facility]]-hemoglobin 8.1.  Ferritin 5.   # S/p venofer - x 4. Recommend  gentle iron  [iron  biglycinate; 28 mg ] 1 pill a day. This pill is unlikely to cause stomach upset or cause constipation. Proceed with Venofer .   #Etiology of iron  deficiency: likley from mentrsual cycles- awaiting Hysterectomy  [facility]-awaiting GI evaluation- screening colonoscopy-HOLD off urine pregnancy test- discussed with pt. [Tubal ligatuon 17 years ago].   # Depression: Bupropion/Neurontin for pains  NO-urine pregnancy test- [Tubal ligatuon 17 years ago]  # DISPOSITION: # venofer  today # follow up 3 months MD; labs- cbc/bmp;LDH; iron  studies; ferritin--  possible venofer - Dr.B  All questions were answered. The patient knows to call the clinic with any problems, questions or concerns.   Gwyn Leos, MD 02/25/2024 3:12 PM

## 2024-02-25 NOTE — Progress Notes (Signed)
 Declined 30 minute post-observation. Aware of risks. Vitals stable at discharge.

## 2024-02-25 NOTE — Assessment & Plan Note (Addendum)
#    Anemia--symptomatic.  Likely due to iron  deficiency - from etiology  menorrhagia.MARCH 2025- [[facility]]-hemoglobin 8.1.  Ferritin 5.   # S/p venofer - x 4. Recommend gentle iron  [iron  biglycinate; 28 mg ] 1 pill a day. This pill is unlikely to cause stomach upset or cause constipation. Proceed with Venofer .   #Etiology of iron  deficiency: likley from mentrsual cycles- awaiting Hysterectomy  [facility]-awaiting GI evaluation- screening colonoscopy-HOLD off urine pregnancy test- discussed with pt. [Tubal ligatuon 17 years ago].   # Depression: Bupropion/Neurontin for pains  NO-urine pregnancy test- [Tubal ligatuon 17 years ago]  # DISPOSITION: # venofer  today # follow up 3 months MD; labs- cbc/bmp;LDH; iron  studies; ferritin--  possible venofer - Dr.B

## 2024-02-25 NOTE — Progress Notes (Signed)
Patient here for follow up. Patient denies any concerns at this time. 

## 2024-02-25 NOTE — Patient Instructions (Signed)

## 2024-05-26 ENCOUNTER — Encounter: Payer: Self-pay | Admitting: Internal Medicine

## 2024-05-26 ENCOUNTER — Inpatient Hospital Stay (HOSPITAL_BASED_OUTPATIENT_CLINIC_OR_DEPARTMENT_OTHER): Admitting: Internal Medicine

## 2024-05-26 ENCOUNTER — Inpatient Hospital Stay: Attending: Internal Medicine

## 2024-05-26 ENCOUNTER — Inpatient Hospital Stay

## 2024-05-26 VITALS — BP 97/67 | HR 76 | Temp 97.1°F | Resp 12 | Ht 64.0 in | Wt 129.1 lb

## 2024-05-26 DIAGNOSIS — D649 Anemia, unspecified: Secondary | ICD-10-CM | POA: Insufficient documentation

## 2024-05-26 DIAGNOSIS — F32A Depression, unspecified: Secondary | ICD-10-CM | POA: Insufficient documentation

## 2024-05-26 DIAGNOSIS — Z79899 Other long term (current) drug therapy: Secondary | ICD-10-CM | POA: Diagnosis not present

## 2024-05-26 LAB — BASIC METABOLIC PANEL - CANCER CENTER ONLY
Anion gap: 7 (ref 5–15)
BUN: 12 mg/dL (ref 6–20)
CO2: 26 mmol/L (ref 22–32)
Calcium: 8.9 mg/dL (ref 8.9–10.3)
Chloride: 102 mmol/L (ref 98–111)
Creatinine: 0.76 mg/dL (ref 0.44–1.00)
GFR, Estimated: >60 mL/min (ref >60)
Glucose, Bld: 84 mg/dL (ref 70–99)
Potassium: 4.4 mmol/L (ref 3.5–5.1)
Sodium: 135 mmol/L (ref 135–145)

## 2024-05-26 LAB — CBC WITH DIFFERENTIAL (CANCER CENTER ONLY)
Abs Immature Granulocytes: 0.01 K/uL (ref 0.00–0.07)
Basophils Absolute: 0 K/uL (ref 0.0–0.1)
Basophils Relative: 1 %
Eosinophils Absolute: 0.2 K/uL (ref 0.0–0.5)
Eosinophils Relative: 5 %
HCT: 39 % (ref 36.0–46.0)
Hemoglobin: 12.8 g/dL (ref 12.0–15.0)
Immature Granulocytes: 0 %
Lymphocytes Relative: 36 %
Lymphs Abs: 1.5 K/uL (ref 0.7–4.0)
MCH: 29.2 pg (ref 26.0–34.0)
MCHC: 32.8 g/dL (ref 30.0–36.0)
MCV: 89 fL (ref 80.0–100.0)
Monocytes Absolute: 0.4 K/uL (ref 0.1–1.0)
Monocytes Relative: 10 %
Neutro Abs: 2 K/uL (ref 1.7–7.7)
Neutrophils Relative %: 48 %
Platelet Count: 336 K/uL (ref 150–400)
RBC: 4.38 MIL/uL (ref 3.87–5.11)
RDW: 13.2 % (ref 11.5–15.5)
WBC Count: 4.1 K/uL (ref 4.0–10.5)
nRBC: 0 % (ref 0.0–0.2)

## 2024-05-26 LAB — IRON AND TIBC
Iron: 94 ug/dL (ref 28–170)
Saturation Ratios: 21 % (ref 10.4–31.8)
TIBC: 442 ug/dL (ref 250–450)
UIBC: 348 ug/dL

## 2024-05-26 LAB — LACTATE DEHYDROGENASE: LDH: 112 U/L (ref 98–192)

## 2024-05-26 LAB — FERRITIN: Ferritin: 8 ng/mL — ABNORMAL LOW (ref 11–307)

## 2024-05-26 NOTE — Assessment & Plan Note (Addendum)
#    Anemia--symptomatic.  Likely due to iron  deficiency - from etiology  menorrhagia.MARCH 2025- [[facility]]-hemoglobin 8.1.  Ferritin 5.   # S/p venofer - x 4. Continue gentle iron  [iron  biglycinate; 28 mg ] 1 pill every other day -today hemoglobin is 12.8.  Continue oral iron  at this time.  # Etiology of iron  deficiency: likley from mentrsual cycles- awaiting Hysterectomy  [facility]- in AUG 2025. - GI evaluation- screening colonoscopy-HOLD off urine pregnancy test- discussed with pt. [Tubal ligatuon 17 years ago].   # Depression: Bupropion/Neurontin  #Since patient is clinically stable I think is reasonable for the patient to follow-up with PCP/can follow-up with us  as needed.  Patient comfortable with the plan; to call us  if any questions or concerns in the interim.  NO-urine pregnancy test- [Tubal ligatuon 17 years ago]  # DISPOSITION: # HOLD venofer  today # follow up as needed- Dr.B

## 2024-05-26 NOTE — Progress Notes (Signed)
 [facility] CONSULT NOTE  Patient Care Team: [facility] as PCP - General Rennie, Cindy SAUNDERS, MD as Consulting Physician (Oncology)  CHIEF COMPLAINTS/PURPOSE OF CONSULTATION: ANEMIA   HEMATOLOGY HISTORY  # ANEMIA[Hb; MCV-platelets- WBC; Iron  sat; ferritin;  GFR- CT/US - ;   HISTORY OF PRESENTING ILLNESS: Patient ambulating-independently.  Alone   Lauren Barron 48 y.o.  female pleasant patient with a history of heavy menstrual cycles /iron  def anemia is here for a follow up.  Patient denies any concerns at this time.  Patient currently on oral iron  3 times a week.  No significant constipation or stomach upset.  She is currently awaiting hysterectomy next month.  Review of Systems  Constitutional:  Negative for chills, diaphoresis, fever and weight loss.  HENT:  Negative for nosebleeds and sore throat.   Eyes:  Negative for double vision.  Respiratory:  Negative for cough, hemoptysis, sputum production, shortness of breath and wheezing.   Cardiovascular:  Negative for chest pain, palpitations, orthopnea and leg swelling.  Gastrointestinal:  Negative for abdominal pain, blood in stool, constipation, diarrhea, heartburn, melena, nausea and vomiting.  Genitourinary:  Negative for dysuria, frequency and urgency.  Musculoskeletal:  Negative for back pain and joint pain.  Skin: Negative.  Negative for itching and rash.  Neurological:  Negative for tingling, focal weakness, weakness and headaches.  Endo/Heme/Allergies:  Does not bruise/bleed easily.  Psychiatric/Behavioral:  Negative for depression. The patient is not nervous/anxious and does not have insomnia.      MEDICAL HISTORY:  History reviewed. No pertinent past medical history.  SURGICAL HISTORY: Past Surgical History:  Procedure Laterality Date   CESAREAN SECTION      SOCIAL HISTORY: Social History   Socioeconomic History   Marital status: Divorced    Spouse name: Not on file    Number of children: Not on file   Years of education: Not on file   Highest education level: Not on file  Occupational History   Not on file  Tobacco Use   Smoking status: Never   Smokeless tobacco: Never  Vaping Use   Vaping status: Never Used  Substance and Sexual Activity   Alcohol use: No   Drug use: No   Sexual activity: Not on file  Other Topics Concern   Not on file  Social History Narrative   Not on file   Social Drivers of Health   Financial Resource Strain: Low Risk  (01/04/2024)   Received from [facility]   Overall Financial Resource Strain (CARDIA)    Difficulty of Paying Living Expenses: Not hard at all  Recent Concern: Financial Resource Strain - High Risk (11/18/2023)   Received from [facility]   Overall Financial Resource Strain (CARDIA)    Difficulty of Paying Living Expenses: Hard  Food Insecurity: Food Insecurity Present (01/07/2024)   Hunger Vital Sign    Worried About Running Out of Food in the Last Year: Sometimes true    Ran Out of Food in the Last Year: Never true  Transportation Needs: No Transportation Needs (01/07/2024)   PRAPARE - Administrator, Civil Service (Medical): No    Lack of Transportation (Non-Medical): No  Physical Activity: Not on file  Stress: Not on file  Social Connections: Not on file  Intimate Partner Violence: Not At Risk (01/07/2024)   Humiliation, Afraid, Rape, and Kick questionnaire    Fear of Current or Ex-Partner: No    Emotionally Abused: No  Physically Abused: No    Sexually Abused: No    FAMILY HISTORY: Family History  Problem Relation Age of Onset   Hypertension Mother    Diabetes Father     ALLERGIES:  is allergic to aspirin and ketorolac tromethamine.  MEDICATIONS:  Current Outpatient Medications  Medication Sig Dispense Refill   albuterol (VENTOLIN HFA) 108 (90 Base) MCG/ACT inhaler SMARTSIG:2 Inhalation Via Inhaler Every 6 Hours PRN     buPROPion  (WELLBUTRIN XL) 150 MG 24 hr tablet Take 1 tablet by mouth daily.     gabapentin (NEURONTIN) 100 MG capsule Take 100 mg by mouth 2 (two) times daily.     metoprolol tartrate (LOPRESSOR) 25 MG tablet Take 12.5 mg by mouth 2 (two) times daily.     No current facility-administered medications for this visit.     PHYSICAL EXAMINATION:   Vitals:   05/26/24 1433  BP: 97/67  Pulse: 76  Resp: 12  Temp: (!) 97.1 F (36.2 C)  SpO2: 100%   Filed Weights   05/26/24 1433  Weight: 129 lb 1.6 oz (58.6 kg)   Left neck mild tenderness 1 cm lymph nodes of the mandible.  Oral mucosa injected.  There is no exudates.  Physical Exam Vitals and nursing note reviewed.  HENT:     Head: Normocephalic and atraumatic.     Mouth/Throat:     Pharynx: Oropharynx is clear.  Eyes:     Extraocular Movements: Extraocular movements intact.     Pupils: Pupils are equal, round, and reactive to light.  Cardiovascular:     Rate and Rhythm: Normal rate and regular rhythm.  Pulmonary:     Comments: Decreased breath sounds bilaterally.  Abdominal:     Palpations: Abdomen is soft.  Musculoskeletal:        General: Normal range of motion.     Cervical back: Normal range of motion.  Skin:    General: Skin is warm.  Neurological:     General: No focal deficit present.     Mental Status: She is alert and oriented to person, place, and time.  Psychiatric:        Behavior: Behavior normal.        Judgment: Judgment normal.      LABORATORY DATA:  I have reviewed the data as listed Lab Results  Component Value Date   WBC 4.1 05/26/2024   HGB 12.8 05/26/2024   HCT 39.0 05/26/2024   MCV 89.0 05/26/2024   PLT 336 05/26/2024   Recent Labs    12/29/23 1344 02/25/24 1422 05/26/24 1435  NA 136 135 135  K 3.1* 4.4 4.4  CL 104 103 102  CO2 23 26 26   GLUCOSE 95 92 84  BUN 7 15 12   CREATININE 0.72 0.60 0.76  CALCIUM 8.6* 9.1 8.9  GFRNONAA >60 >60 >60  PROT 6.8  --   --   ALBUMIN 3.8  --   --   AST  18  --   --   ALT 13  --   --   ALKPHOS 40  --   --   BILITOT 0.3  --   --     No results found.   ASSESSMENT & PLAN:   Symptomatic anemia #  Anemia--symptomatic.  Likely due to iron  deficiency - from etiology  menorrhagia.MARCH 2025- [[facility]]-hemoglobin 8.1.  Ferritin 5.   # S/p venofer - x 4. Continue gentle iron  [iron  biglycinate; 28 mg ] 1 pill every other day -today hemoglobin is 12.8.  Continue oral iron  at this time.  # Etiology of iron  deficiency: likley from mentrsual cycles- awaiting Hysterectomy  [facility]- in AUG 2025. - GI evaluation- screening colonoscopy-HOLD off urine pregnancy test- discussed with pt. [Tubal ligatuon 17 years ago].   # Depression: Bupropion/Neurontin  #Since patient is clinically stable I think is reasonable for the patient to follow-up with PCP/can follow-up with us  as needed.  Patient comfortable with the plan; to call us  if any questions or concerns in the interim.  NO-urine pregnancy test- [Tubal ligatuon 17 years ago]  # DISPOSITION: # HOLD venofer  today # follow up as needed- Dr.B  All questions were answered. The patient knows to call the clinic with any problems, questions or concerns.   Cindy JONELLE Joe, MD 05/26/2024 3:08 PM

## 2024-05-26 NOTE — Progress Notes (Signed)
 Fatigue/weakness: NO Dyspena: NO Light headedness: OCCASIONALLY  Blood in stool: NO   C/O losing hair more so in the last month.

## 2024-05-29 NOTE — H&P (Signed)
 Patient ID: Lauren Barron is a 48 y.o. female presenting with Pre-Op Sign Consents  and Endometrial Biopsy  on 05/29/2024   HPI: Summary of Complaint: ? Onset: ~Sept 2024 ? Duration: cycle is light but lasting at least 2 weeks, cramping is still improved ? Severity: severe ? Aggravating factors: none ? Relieving factors: none ? Treatment: Previously placed on Slynd Nov/2022 then switched to Micronor in Dec/2023 -> given Lysteda on 01/03/24 ? Associated signs and symptoms: none Context: BTL 2008   No LMP recorded (lmp unknown).    Menarche: 48yo Menstrual cycles: Reports regular cycles, every 25 days. Duration 7 days, flow heavy, mod to severe dysmenorrhea. Denies intermenstrual bleeding.    Last pap smear: 2018 History of abnormal pap smears: yes 2016, history unclear   Sexually active: yes, female . Concerns: none. Current contraception: BTL 2008  History of sexually transmitted infections: no     Problem List Patient Active Problem List Diagnosis  Inappropriate sinus tachycardia (CMS/HHS-HCC)  SOB (shortness of breath) on exertion  Chest pain with low risk for cardiac etiology  Raynaud's disease  Heart palpitations  Menorrhagia with regular cycle  Chronic fatigue  Symptomatic anemia      Past Medical History:  has a past medical history of Allergy, Anemia due to chronic blood loss, Anxiety (2015), Arthritis, Chronic fatigue, Depression, History of abnormal cervical Pap smear (10/02/2015), History of stroke (2003), Hyperlipidemia, Menorrhagia with regular cycle, and Raynaud's disease.  Past Surgical History:  has a past surgical history that includes Tubal ligation (2008) and Cesarean section (2008). Family History: family history includes Aneurysm in her brother; Arthritis in her mother; Cirrhosis in her brother; Diabetes in her father; High blood pressure (Hypertension) in her brother, brother, father, and mother; No Known Problems in her sister; Osteoarthritis in her  mother. Social History:  reports that she has never smoked. She has never been exposed to tobacco smoke. She has never used smokeless tobacco. She reports that she does not drink alcohol and does not use drugs. OB/GYN History:  OB History       Gravida 3   Para 3   Term 3   Preterm     AB     Living 3       SAB     IAB     Ectopic     Molar     Multiple     Live Births 3          Allergies: is allergic to aspirin and ketorolac tromethamine. Medications:  Current Medications Current Outpatient Medications:    buPROPion (WELLBUTRIN XL) 150 MG XL tablet, TAKE 1 TABLET BY MOUTH EVERY DAY, Disp: 30 tablet, Rfl: 2   gabapentin (NEURONTIN) 100 MG capsule, TAKE 1-2 CAPSULES (100-200 MG TOTAL) BY MOUTH 2 (TWO) TIMES DAILY, Disp: 60 capsule, Rfl: 5   metoprolol tartrate (LOPRESSOR) 25 MG tablet, Take 0.5 tablets (12.5 mg total) by mouth 2 (two) times daily, Disp: 30 tablet, Rfl: 11   tranexamic acid (LYSTEDA) 650 mg tablet, Take 2 tablets (1,300 mg total) by mouth 3 (three) times daily Take for a maximum of 5 days during monthly menstruation. (Patient not taking: Reported on 05/29/2024), Disp: 30 tablet, Rfl: 0    Review of Systems: Pertinent positives and negatives in the HPI, otherwise an 8-system review of systems was negative.      Exam:   Vitals:   05/29/24 1520 BP: 99/65 Pulse: 80  Body mass index is 22.31 kg/m.   General:  Well-developed, well-nourished  female in no acute distress Lungs: CTA  CV : RRR without murmur   Abdomen: soft, no masses, normal active bowel sounds,  non-tender, no rebound tenderness, no hernias noted Genitalia:     Pelvic exam: very tender        External: Tanner stage 5, normal female genitalia without lesions or masses        Bladder: Normal size without masses or tenderness, well-supported        Urethra: No lesions or discharge with palpation. Normal urethral size and location, no prolapse        Vagina: normal  physiological discharge, without lesions or masses        Cervix: normal without lesions or masses        Adnexa: normal bimanual exam without masses or fullness        Uterus: Normal size and position without masses or tenderness.         Anus/Perineum: Normal external exam        TVUS: 01/11/24  Visualized. Size 95 mm x 63 mm x 50 mm Normal Position: anteverted Malformations: none Myometrium: appears normal Endometrium: appears normal/appropriate. Endometrial thickness, total 10.8 mm Polyp(s)   1.  Size 20 mm x 8 mm x 19 mm. Mean 15.7 mm. Doppler: color score 2 (minimal color). Possible polyp vs clot        2.  Size 8 mm x 6 mm x 8 mm. Mean 7.3 mm. Doppler: color score 2 (minimal color). Fundal   Right Ovary =========   Visualized, Normal. Outline: Normal. Morphology: Appropriate. Size 26 mm x 13 mm x 18 mm No cysts identified Follicles identified   Left Ovary ========   Visualized, Normal. Outline: Normal. Morphology: Appropriate. Size 33 mm x 22 mm x 19 mm Follicles identified Cyst(s)    Size 30.0 mm x 14.0 mm x 20 mm. Mean 21.3 mm. Vol 4.398 cm. Complex with septation measuring 0.20cm     Impression:   The primary encounter diagnosis was Left ovarian cyst. Diagnoses of Menorrhagia with regular cycle and Screening for cervical cancer were also pertinent to this visit.     Patient returns for a preoperative discussion regarding her plans to proceed with definitive surgical treatment of her heavy bleeding by  robotic assisted total laparoscopic hysterectomy with bilateral salpingectomy and ovarian cystectomy (LEFT).   EMBx, pap smear and CA125 today   The patient and I discussed the technical aspects of the procedure including the potential for risks and complications.  These include but are not limited to the risk of infection requiring post-operative antibiotics or further procedures.  We talked about the risk of injury to adjacent organs including bladder, bowel,  ureter, blood vessels or nerves.  We talked about the need to convert to an open incision.  We talked about the possible need for blood transfusion.  We talked about postop complications such as thromboembolic or cardiopulmonary complications.  All of her questions were answered.  Her preoperative exam was completed and the appropriate consents were signed. She is scheduled to undergo this procedure in the near future.     Orders Placed This Encounter Procedures  CA 125, Serum (Serial) - [facility]     Release to patient:   Immediate  Pathology Report - [facility]     Endometrial Biopsy     Release to patient:   Immediate  IGP, Aptima HPV, rfx 16/18,45 - [facility]     Release to patient:   Immediate  Return for Postop check.

## 2024-06-07 ENCOUNTER — Other Ambulatory Visit: Payer: Self-pay | Admitting: Internal Medicine

## 2024-06-07 ENCOUNTER — Other Ambulatory Visit: Payer: Self-pay

## 2024-06-07 ENCOUNTER — Encounter
Admission: RE | Admit: 2024-06-07 | Discharge: 2024-06-07 | Disposition: A | Discharge status: Home / Self Care | Source: Ambulatory Visit | Attending: Obstetrics and Gynecology | Admitting: Obstetrics and Gynecology

## 2024-06-07 DIAGNOSIS — Z1231 Encounter for screening mammogram for malignant neoplasm of breast: Secondary | ICD-10-CM

## 2024-06-07 DIAGNOSIS — Z01818 Encounter for other preprocedural examination: Secondary | ICD-10-CM

## 2024-06-07 HISTORY — DX: Anemia, unspecified: D64.9

## 2024-06-07 HISTORY — DX: Palpitations: R00.2

## 2024-06-07 HISTORY — DX: Raynaud's syndrome without gangrene: I73.00

## 2024-06-07 HISTORY — DX: Dysmenorrhea, unspecified: N94.6

## 2024-06-07 HISTORY — DX: Inappropriate sinus tachycardia, so stated: I47.11

## 2024-06-07 HISTORY — DX: Excessive and frequent menstruation with regular cycle: N92.0

## 2024-06-07 HISTORY — DX: Unspecified ovarian cyst, left side: N83.202

## 2024-06-07 HISTORY — DX: Family history of other specified conditions: Z84.89

## 2024-06-07 HISTORY — DX: Stress incontinence (female) (male): N39.3

## 2024-06-07 HISTORY — DX: Chronic fatigue, unspecified: R53.82

## 2024-06-07 NOTE — Patient Instructions (Addendum)
 Your procedure is scheduled on:06-15-24 Thursday Report to the Registration Desk on the 1st floor of the [facility].Then proceed to the 2nd floor Surgery Desk To find out your arrival time, please call [phone] between 1PM - 3PM on:06-14-24 Wednesday If your arrival time is 6:00 am, do not arrive before that time as the [facility] entrance doors do not open until 6:00 am.  REMEMBER: Instructions that are not followed completely may result in serious medical risk, up to and including death; or upon the discretion of your surgeon and anesthesiologist your surgery may need to be rescheduled.  Do not eat food after midnight the night before surgery.  No gum chewing or hard candies.  You may however, drink CLEAR liquids up to 2 hours before you are scheduled to arrive for your surgery. Do not drink anything within 2 hours of your scheduled arrival time.  Clear liquids include: - water  - apple juice without pulp - gatorade (not RED colors) - black coffee or tea (Do NOT add milk or creamers to the coffee or tea) Do NOT drink anything that is not on this list.  One week prior to surgery:Stop NOW (06-07-24) Stop Anti-inflammatories (NSAIDS) such as Advil, Aleve, Ibuprofen, Motrin, Naproxen, Naprosyn and Aspirin based products such as Excedrin, Goody's Powder, BC Powder. Stop ANY OVER THE COUNTER supplements until after surgery (Vitamin D3, Turmeric)  You may however, continue to take Tylenol  if needed for pain up until the day of surgery.  Continue taking all of your other prescription medications up until the day of surgery.  ON THE DAY OF SURGERY ONLY TAKE THESE MEDICATIONS WITH SIPS OF WATER: -buPROPion (WELLBUTRIN XL)  -gabapentin (NEURONTIN)  -metoprolol tartrate (LOPRESSOR)   No Alcohol for 24 hours before or after surgery.  No Smoking including e-cigarettes for 24 hours before surgery.  No chewable tobacco products for at least 6 hours before surgery.  No nicotine patches on  the day of surgery.  Do not use any recreational drugs for at least a week (preferably 2 weeks) before your surgery.  Please be advised that the combination of cocaine and anesthesia may have negative outcomes, up to and including death. If you test positive for cocaine, your surgery will be cancelled.  On the morning of surgery brush your teeth with toothpaste and water, you may rinse your mouth with mouthwash if you wish. Do not swallow any toothpaste or mouthwash.  Use CHG Soap as directed on instruction sheet.  Do not wear jewelry, make-up, hairpins, clips or nail polish.  For welded (permanent) jewelry: bracelets, anklets, waist bands, etc.  Please have this removed prior to surgery.  If it is not removed, there is a chance that hospital personnel will need to cut it off on the day of surgery.  Do not wear lotions, powders, or perfumes.   Do not shave body hair from the neck down 48 hours before surgery.  Contact lenses, hearing aids and dentures may not be worn into surgery.  Do not bring valuables to the hospital. [facility] is not responsible for any missing/lost belongings or valuables.   Notify your doctor if there is any change in your medical condition (cold, fever, infection).  Wear comfortable clothing (specific to your surgery type) to the hospital.  After surgery, you can help prevent lung complications by doing breathing exercises.  Take deep breaths and cough every 1-2 hours. Your doctor may order a device called an Incentive Spirometer to help you take deep breaths.  When coughing or sneezing, hold a pillow firmly against your incision with both hands. This is called "splinting." Doing this helps protect your incision. It also decreases belly discomfort.  If you are being admitted to the hospital overnight, leave your suitcase in the car. After surgery it may be brought to your room.  In case of increased patient census, it may be necessary for you, the patient,  to continue your postoperative care in the Same Day Surgery department.  If you are being discharged the day of surgery, you will not be allowed to drive home. You will need a responsible individual to drive you home and stay with you for 24 hours after surgery.   If you are taking public transportation, you will need to have a responsible individual with you.  Please call the Pre-admissions Testing Dept. at [phone] if you have any questions about these instructions.  Surgery Visitation Policy:  Patients having surgery or a procedure may have two visitors.  Children under the age of 68 must have an adult with them who is not the patient.     Preparing for Surgery with CHLORHEXIDINE GLUCONATE (CHG) Soap  Chlorhexidine Gluconate (CHG) Soap  o An antiseptic cleaner that kills germs and bonds with the skin to continue killing germs even after washing  o Used for showering the night before surgery and morning of surgery  Before surgery, you can play an important role by reducing the number of germs on your skin.  CHG (Chlorhexidine gluconate) soap is an antiseptic cleanser which kills germs and bonds with the skin to continue killing germs even after washing.  Please do not use if you have an allergy to CHG or antibacterial soaps. If your skin becomes reddened/irritated stop using the CHG.  1. Shower the NIGHT BEFORE SURGERY and the MORNING OF SURGERY with CHG soap.  2. If you choose to wash your hair, wash your hair first as usual with your normal shampoo.  3. After shampooing, rinse your hair and body thoroughly to remove the shampoo.  4. Use CHG as you would any other liquid soap. You can apply CHG directly to the skin and wash gently with a scrungie or a clean washcloth.  5. Apply the CHG soap to your body only from the neck down. Do not use on open wounds or open sores. Avoid contact with your eyes, ears, mouth, and genitals (private parts). Wash face and genitals  (private parts) with your normal soap.  6. Wash thoroughly, paying special attention to the area where your surgery will be performed.  7. Thoroughly rinse your body with warm water.  8. Do not shower/wash with your normal soap after using and rinsing off the CHG soap.  9. Pat yourself dry with a clean towel.  10. Wear clean pajamas to bed the night before surgery.  12. Place clean sheets on your bed the night of your first shower and do not sleep with pets.  13. Shower again with the CHG soap on the day of surgery prior to arriving at the hospital.  14. Do not apply any deodorants/lotions/powders.  15. Please wear clean clothes to the hospital.  How to Use an Incentive Spirometer An incentive spirometer is a tool that measures how well you are filling your lungs with each breath. Learning to take long, deep breaths using this tool can help you keep your lungs clear and active. This may help to reverse or lessen your chance of developing breathing (pulmonary) problems, especially  infection. You may be asked to use a spirometer: After a surgery. If you have a lung problem or a history of smoking. After a long period of time when you have been unable to move or be active. If the spirometer includes an indicator to show the highest number that you have reached, your health care provider or respiratory therapist will help you set a goal. Keep a log of your progress as told by your health care provider. What are the risks? Breathing too quickly may cause dizziness or cause you to pass out. Take your time so you do not get dizzy or light-headed. If you are in pain, you may need to take pain medicine before doing incentive spirometry. It is harder to take a deep breath if you are having pain. How to use your incentive spirometer  Sit up on the edge of your bed or on a chair. Hold the incentive spirometer so that it is in an upright position. Before you use the spirometer, breathe out  normally. Place the mouthpiece in your mouth. Make sure your lips are closed tightly around it. Breathe in slowly and as deeply as you can through your mouth, causing the piston or the ball to rise toward the top of the chamber. Hold your breath for 3-5 seconds, or for as long as possible. If the spirometer includes a coach indicator, use this to guide you in breathing. Slow down your breathing if the indicator goes above the marked areas. Remove the mouthpiece from your mouth and breathe out normally. The piston or ball will return to the bottom of the chamber. Rest for a few seconds, then repeat the steps 10 or more times. Take your time and take a few normal breaths between deep breaths so that you do not get dizzy or light-headed. Do this every 1-2 hours when you are awake. If the spirometer includes a goal marker to show the highest number you have reached (best effort), use this as a goal to work toward during each repetition. After each set of 10 deep breaths, cough a few times. This will help to make sure that your lungs are clear. If you have an incision on your chest or abdomen from surgery, place a pillow or a rolled-up towel firmly against the incision when you cough. This can help to reduce pain while taking deep breaths and coughing. General tips When you are able to get out of bed: Walk around often. Continue to take deep breaths and cough in order to clear your lungs. Keep using the incentive spirometer until your health care provider says it is okay to stop using it. If you have been in the hospital, you may be told to keep using the spirometer at home. Contact a health care provider if: You are having difficulty using the spirometer. You have trouble using the spirometer as often as instructed. Your pain medicine is not giving enough relief for you to use the spirometer as told. You have a fever. Get help right away if: You develop shortness of breath. You develop a cough  with bloody mucus from the lungs. You have fluid or blood coming from an incision site after you cough. Summary An incentive spirometer is a tool that can help you learn to take long, deep breaths to keep your lungs clear and active. You may be asked to use a spirometer after a surgery, if you have a lung problem or a history of smoking, or if you have been inactive  for a long period of time. Use your incentive spirometer as instructed every 1-2 hours while you are awake. If you have an incision on your chest or abdomen, place a pillow or a rolled-up towel firmly against your incision when you cough. This will help to reduce pain. Get help right away if you have shortness of breath, you cough up bloody mucus, or blood comes from your incision when you cough. This information is not intended to replace advice given to you by your health care provider. Make sure you discuss any questions you have with your health care provider. Document Revised: 08/27/2023 Document Reviewed: 08/27/2023 Elsevier Patient Education  2024 [employer].   [employer] Directory to address health-related social needs:  [email or web address]

## 2024-06-08 ENCOUNTER — Encounter
Admission: RE | Admit: 2024-06-08 | Discharge: 2024-06-08 | Disposition: A | Discharge status: Home / Self Care | Source: Ambulatory Visit | Attending: Obstetrics and Gynecology | Admitting: Obstetrics and Gynecology

## 2024-06-08 DIAGNOSIS — Z01812 Encounter for preprocedural laboratory examination: Secondary | ICD-10-CM | POA: Diagnosis not present

## 2024-06-08 DIAGNOSIS — D649 Anemia, unspecified: Secondary | ICD-10-CM | POA: Insufficient documentation

## 2024-06-08 DIAGNOSIS — N920 Excessive and frequent menstruation with regular cycle: Secondary | ICD-10-CM | POA: Insufficient documentation

## 2024-06-08 DIAGNOSIS — Z01818 Encounter for other preprocedural examination: Secondary | ICD-10-CM | POA: Diagnosis present

## 2024-06-08 LAB — TYPE AND SCREEN
ABO/RH(D): A POS
Antibody Screen: NEGATIVE

## 2024-06-14 MED ORDER — ORAL CARE MOUTH RINSE: 15.0000 mL | Freq: Once | OROMUCOSAL | Status: AC

## 2024-06-14 MED ORDER — LACTATED RINGERS IV SOLN: INTRAVENOUS | Status: DC

## 2024-06-14 MED ORDER — CIPROFLOXACIN IN D5W 400 MG/200ML IV SOLN
400.0000 mg | INTRAVENOUS | Status: AC
  Administered 2024-06-15: 400 mg via INTRAVENOUS

## 2024-06-14 MED ORDER — POVIDONE-IODINE 10 % EX SWAB: 2.0000 | Freq: Once | CUTANEOUS | Status: DC

## 2024-06-14 MED ORDER — CEFAZOLIN SODIUM-DEXTROSE 2-4 GM/100ML-% IV SOLN
2.0000 g | INTRAVENOUS | Status: AC
  Administered 2024-06-15: 2 g via INTRAVENOUS

## 2024-06-14 MED ORDER — CHLORHEXIDINE GLUCONATE 0.12 % MT SOLN
15.0000 mL | Freq: Once | OROMUCOSAL | Status: AC
  Administered 2024-06-15: 15 mL via OROMUCOSAL

## 2024-06-14 MED ORDER — METRONIDAZOLE 500 MG/100ML IV SOLN
500.0000 mg | INTRAVENOUS | Status: AC
  Administered 2024-06-15: 500 mg via INTRAVENOUS
  Filled 2024-06-14: qty 100

## 2024-06-14 MED ORDER — ACETAMINOPHEN 500 MG PO TABS
1000.0000 mg | ORAL_TABLET | ORAL | Status: AC
  Administered 2024-06-15: 1000 mg via ORAL

## 2024-06-15 ENCOUNTER — Encounter
Admission: RE | Disposition: A | Discharge status: Home / Self Care | Payer: Self-pay | Source: Ambulatory Visit | Attending: Obstetrics and Gynecology

## 2024-06-15 ENCOUNTER — Ambulatory Visit
Admission: RE | Admit: 2024-06-15 | Discharge: 2024-06-15 | Disposition: A | Discharge status: Home / Self Care | Source: Ambulatory Visit | Attending: Obstetrics and Gynecology | Admitting: Obstetrics and Gynecology

## 2024-06-15 ENCOUNTER — Other Ambulatory Visit: Payer: Self-pay

## 2024-06-15 ENCOUNTER — Encounter: Payer: Self-pay | Admitting: Obstetrics and Gynecology

## 2024-06-15 ENCOUNTER — Ambulatory Visit: Admitting: Certified Registered"

## 2024-06-15 ENCOUNTER — Ambulatory Visit: Payer: Self-pay | Admitting: Urgent Care

## 2024-06-15 DIAGNOSIS — D649 Anemia, unspecified: Secondary | ICD-10-CM

## 2024-06-15 DIAGNOSIS — N920 Excessive and frequent menstruation with regular cycle: Secondary | ICD-10-CM

## 2024-06-15 DIAGNOSIS — N921 Excessive and frequent menstruation with irregular cycle: Secondary | ICD-10-CM | POA: Diagnosis not present

## 2024-06-15 DIAGNOSIS — N888 Other specified noninflammatory disorders of cervix uteri: Secondary | ICD-10-CM | POA: Insufficient documentation

## 2024-06-15 DIAGNOSIS — R102 Pelvic and perineal pain: Secondary | ICD-10-CM | POA: Diagnosis present

## 2024-06-15 DIAGNOSIS — Z01818 Encounter for other preprocedural examination: Secondary | ICD-10-CM

## 2024-06-15 HISTORY — PX: HYSTERECTOMY, TOTAL, LAPAROSCOPIC, ROBOT-ASSISTED WITH SALPINGECTOMY: SHX7587

## 2024-06-15 HISTORY — PX: CYSTOSCOPY: SHX5120

## 2024-06-15 LAB — ABO/RH: ABO/RH(D): A POS

## 2024-06-15 SURGERY — HYSTERECTOMY, TOTAL, LAPAROSCOPIC, ROBOT-ASSISTED WITH SALPINGECTOMY
Anesthesia: General | Site: Uterus

## 2024-06-15 MED ORDER — ROCURONIUM BROMIDE 100 MG/10ML IV SOLN
INTRAVENOUS | Status: DC | PRN
  Administered 2024-06-15: 20 mg via INTRAVENOUS
  Administered 2024-06-15: 50 mg via INTRAVENOUS

## 2024-06-15 MED ORDER — PHENYLEPHRINE 80 MCG/ML (10ML) SYRINGE FOR IV PUSH (FOR BLOOD PRESSURE SUPPORT)
PREFILLED_SYRINGE | INTRAVENOUS | Status: DC | PRN
  Administered 2024-06-15 (×2): 80 ug via INTRAVENOUS

## 2024-06-15 MED ORDER — EPHEDRINE SULFATE-NACL 50-0.9 MG/10ML-% IV SOSY
PREFILLED_SYRINGE | INTRAVENOUS | Status: DC | PRN
  Administered 2024-06-15: 5 mg via INTRAVENOUS

## 2024-06-15 MED ORDER — CEFAZOLIN SODIUM-DEXTROSE 2-4 GM/100ML-% IV SOLN
INTRAVENOUS | Status: AC
  Filled 2024-06-15: qty 100

## 2024-06-15 MED ORDER — MIDAZOLAM HCL 2 MG/2ML IJ SOLN
INTRAMUSCULAR | Status: AC
  Filled 2024-06-15: qty 2

## 2024-06-15 MED ORDER — MIDAZOLAM HCL 2 MG/2ML IJ SOLN
INTRAMUSCULAR | Status: DC | PRN
  Administered 2024-06-15: 2 mg via INTRAVENOUS

## 2024-06-15 MED ORDER — PROPOFOL 1000 MG/100ML IV EMUL
INTRAVENOUS | Status: AC
  Filled 2024-06-15: qty 100

## 2024-06-15 MED ORDER — FENTANYL CITRATE (PF) 100 MCG/2ML IJ SOLN
25.0000 ug | INTRAMUSCULAR | Status: DC | PRN
  Administered 2024-06-15: 50 ug via INTRAVENOUS

## 2024-06-15 MED ORDER — FENTANYL CITRATE (PF) 100 MCG/2ML IJ SOLN
INTRAMUSCULAR | Status: AC
Start: 2024-06-15 — End: 2024-06-15
  Filled 2024-06-15: qty 2

## 2024-06-15 MED ORDER — ACETAMINOPHEN EXTRA STRENGTH 500 MG PO TABS
1000.0000 mg | ORAL_TABLET | Freq: Four times a day (QID) | ORAL | 0 refills | Status: AC
Start: 2024-06-15 — End: 2024-06-18

## 2024-06-15 MED ORDER — LIDOCAINE HCL (CARDIAC) PF 100 MG/5ML IV SOSY
PREFILLED_SYRINGE | INTRAVENOUS | Status: DC | PRN
  Administered 2024-06-15: 50 mg via INTRAVENOUS

## 2024-06-15 MED ORDER — ACETAMINOPHEN 500 MG PO TABS
ORAL_TABLET | ORAL | Status: AC
  Filled 2024-06-15: qty 2

## 2024-06-15 MED ORDER — ONDANSETRON HCL 4 MG/2ML IJ SOLN
INTRAMUSCULAR | Status: DC | PRN
  Administered 2024-06-15: 4 mg via INTRAVENOUS

## 2024-06-15 MED ORDER — BUPIVACAINE HCL 0.5 % IJ SOLN
INTRAMUSCULAR | Status: DC | PRN
  Administered 2024-06-15: 10 mL

## 2024-06-15 MED ORDER — FENTANYL CITRATE (PF) 100 MCG/2ML IJ SOLN
INTRAMUSCULAR | Status: DC | PRN
  Administered 2024-06-15 (×2): 50 ug via INTRAVENOUS

## 2024-06-15 MED ORDER — SEVOFLURANE IN SOLN
RESPIRATORY_TRACT | Status: AC
  Filled 2024-06-15: qty 250

## 2024-06-15 MED ORDER — GABAPENTIN 300 MG PO CAPS: 300.0000 mg | ORAL_CAPSULE | Freq: Every day | ORAL | 0 refills | Status: AC

## 2024-06-15 MED ORDER — BUPIVACAINE HCL (PF) 0.5 % IJ SOLN
INTRAMUSCULAR | Status: AC
  Filled 2024-06-15: qty 30

## 2024-06-15 MED ORDER — PROPOFOL 10 MG/ML IV BOLUS
INTRAVENOUS | Status: AC
  Filled 2024-06-15: qty 20

## 2024-06-15 MED ORDER — FENTANYL CITRATE (PF) 100 MCG/2ML IJ SOLN
INTRAMUSCULAR | Status: AC
  Filled 2024-06-15: qty 2

## 2024-06-15 MED ORDER — CHLORHEXIDINE GLUCONATE 0.12 % MT SOLN
OROMUCOSAL | Status: AC
  Filled 2024-06-15: qty 15

## 2024-06-15 MED ORDER — PROPOFOL 500 MG/50ML IV EMUL
INTRAVENOUS | Status: DC | PRN
Start: 2024-06-15 — End: 2024-06-15
  Administered 2024-06-15: 25 ug/kg/min via INTRAVENOUS

## 2024-06-15 MED ORDER — OXYCODONE HCL 5 MG PO TABS
ORAL_TABLET | ORAL | Status: AC
Start: 2024-06-15 — End: 2024-06-15
  Filled 2024-06-15: qty 1

## 2024-06-15 MED ORDER — 0.9 % SODIUM CHLORIDE (POUR BTL) OPTIME
TOPICAL | Status: DC | PRN
  Administered 2024-06-15: 500 mL

## 2024-06-15 MED ORDER — DROPERIDOL 2.5 MG/ML IJ SOLN: 0.6250 mg | Freq: Once | INTRAMUSCULAR | Status: DC | PRN

## 2024-06-15 MED ORDER — DIPHENHYDRAMINE HCL 50 MG/ML IJ SOLN
INTRAMUSCULAR | Status: DC | PRN
  Administered 2024-06-15: 12.5 mg via INTRAVENOUS

## 2024-06-15 MED ORDER — OXYCODONE HCL 5 MG PO TABS
5.0000 mg | ORAL_TABLET | Freq: Once | ORAL | Status: AC
  Administered 2024-06-15: 5 mg via ORAL

## 2024-06-15 MED ORDER — PROPOFOL 10 MG/ML IV BOLUS
INTRAVENOUS | Status: DC | PRN
  Administered 2024-06-15: 160 mg via INTRAVENOUS

## 2024-06-15 MED ORDER — OXYCODONE HCL 5 MG PO TABS: 5.0000 mg | ORAL_TABLET | ORAL | 0 refills | Status: AC | PRN

## 2024-06-15 MED ORDER — SUGAMMADEX SODIUM 200 MG/2ML IV SOLN
INTRAVENOUS | Status: DC | PRN
  Administered 2024-06-15: 200 mg via INTRAVENOUS

## 2024-06-15 MED ORDER — IBUPROFEN 800 MG PO TABS: 800.0000 mg | ORAL_TABLET | Freq: Three times a day (TID) | ORAL | 1 refills | Status: AC

## 2024-06-15 MED ORDER — DEXAMETHASONE SODIUM PHOSPHATE 10 MG/ML IJ SOLN
INTRAMUSCULAR | Status: DC | PRN
  Administered 2024-06-15: 8 mg via INTRAVENOUS

## 2024-06-15 MED ORDER — DOCUSATE SODIUM 100 MG PO CAPS: 100.0000 mg | ORAL_CAPSULE | Freq: Two times a day (BID) | ORAL | 0 refills | Status: AC

## 2024-06-15 MED ORDER — CIPROFLOXACIN IN D5W 400 MG/200ML IV SOLN
INTRAVENOUS | Status: AC
  Filled 2024-06-15: qty 200

## 2024-06-15 SURGICAL SUPPLY — 53 items
BAG URINE DRAIN 2000ML AR STRL (UROLOGICAL SUPPLIES) ×3 IMPLANT
BLADE SURG SZ11 CARB STEEL (BLADE) ×3 IMPLANT
CANNULA CAP OBTURATR AIRSEAL 8 (CAP) ×1 IMPLANT
CATH ROBINSON RED A/P 16FR (CATHETERS) ×2 IMPLANT
CATH URTH 16FR FL 2W BLN LF (CATHETERS) ×3 IMPLANT
COVER TIP SHEARS 8 DVNC (MISCELLANEOUS) ×1 IMPLANT
DERMABOND ADVANCED .7 DNX12 (GAUZE/BANDAGES/DRESSINGS) ×1 IMPLANT
DRAPE ARM DVNC X/XI (DISPOSABLE) ×4 IMPLANT
DRAPE COLUMN DVNC XI (DISPOSABLE) ×1 IMPLANT
DRIVER NDL MEGA 8 DVNC XI (INSTRUMENTS) IMPLANT
DRIVER NDLE MEGA DVNC XI (INSTRUMENTS) ×3 IMPLANT
DRSG TEGADERM 2-3/8X2-3/4 SM (GAUZE/BANDAGES/DRESSINGS) ×6 IMPLANT
FORCEPS BPLR FENES DVNC XI (FORCEP) ×1 IMPLANT
GAUZE 4X4 16PLY ~~LOC~~+RFID DBL (SPONGE) ×5 IMPLANT
GLOVE BIO SURGEON STRL SZ7 (GLOVE) ×12 IMPLANT
GLOVE INDICATOR 7.5 STRL GRN (GLOVE) ×6 IMPLANT
GOWN STRL REUS W/ TWL LRG LVL3 (GOWN DISPOSABLE) ×9 IMPLANT
IRRIGATION STRYKERFLOW (MISCELLANEOUS) IMPLANT
IV LACTATED RINGERS 1000ML (IV SOLUTION) ×2 IMPLANT
KIT PINK PAD W/HEAD ARM REST (MISCELLANEOUS) ×3 IMPLANT
LABEL OR SOLS (LABEL) ×3 IMPLANT
LIGASURE VESSEL 5MM BLUNT TIP (ELECTROSURGICAL) IMPLANT
MANIFOLD NEPTUNE II (INSTRUMENTS) ×3 IMPLANT
NS IRRIG 500ML POUR BTL (IV SOLUTION) ×3 IMPLANT
OBTURATOR OPTICALSTD 8 DVNC (TROCAR) ×1 IMPLANT
OCCLUDER COLPOPNEUMO (BALLOONS) ×1 IMPLANT
PACK GYN LAPAROSCOPIC (MISCELLANEOUS) ×3 IMPLANT
PAD OB MATERNITY 11 LF (PERSONAL CARE ITEMS) ×3 IMPLANT
PAD PREP OB/GYN DISP 24X41 (PERSONAL CARE ITEMS) ×3 IMPLANT
POWDER SURGICEL 3.0 GRAM (HEMOSTASIS) IMPLANT
RETRACTOR RING XSMALL (MISCELLANEOUS) IMPLANT
RUMI II GYRUS 3.5CM BLUE (DISPOSABLE) ×1 IMPLANT
SCISSORS MNPLR CVD DVNC XI (INSTRUMENTS) ×1 IMPLANT
SCRUB CHG 4% DYNA-HEX 4OZ (MISCELLANEOUS) ×3 IMPLANT
SEAL UNIV 5-12 XI (MISCELLANEOUS) ×2 IMPLANT
SEALER VESSEL EXT DVNC XI (MISCELLANEOUS) ×1 IMPLANT
SET BI-LUMEN FLTR TB AIRSEAL (TUBING) ×1 IMPLANT
SET CYSTO W/LG BORE CLAMP LF (SET/KITS/TRAYS/PACK) ×3 IMPLANT
SHEARS HARMONIC 36 ACE (MISCELLANEOUS) IMPLANT
SOLUTION PREP PVP 2OZ (MISCELLANEOUS) ×3 IMPLANT
SOLUTION SCRB POV-IOD 4OZ 7.5% (MISCELLANEOUS) ×2 IMPLANT
STRIP CLOSURE SKIN 1/2X4 (GAUZE/BANDAGES/DRESSINGS) ×2 IMPLANT
SURGILUBE 2OZ TUBE FLIPTOP (MISCELLANEOUS) ×2 IMPLANT
SUT VIC AB 0 CT1 27XCR 8 STRN (SUTURE) ×2 IMPLANT
SUT VIC AB 2-0 UR6 27 (SUTURE) ×2 IMPLANT
SUT VIC AB 4-0 SH 27XANBCTRL (SUTURE) ×4 IMPLANT
SYR 10ML LL (SYRINGE) ×3 IMPLANT
TIP ENDOSCOPIC SURGICEL (TIP) IMPLANT
TIP UTERINE 6.7X8CM BLUE DISP (MISCELLANEOUS) ×1 IMPLANT
TRAP FLUID SMOKE EVACUATOR (MISCELLANEOUS) ×2 IMPLANT
TROCAR Z-THRD FIOS HNDL 11X100 (TROCAR) ×2 IMPLANT
TUBING EVAC SMOKE HEATED PNEUM (TUBING) ×2 IMPLANT
WATER STERILE IRR 500ML POUR (IV SOLUTION) ×2 IMPLANT

## 2024-06-15 NOTE — Anesthesia Procedure Notes (Signed)
 Procedure Name: Intubation Date/Time: 06/15/2024 7:43 AM  Performed by: Rosine Shona Jansky, CRNAPre-anesthesia Checklist: Patient identified, Emergency Drugs available, Suction available and Patient being monitored Patient Re-evaluated:Patient Re-evaluated prior to induction Oxygen Delivery Method: Circle system utilized Preoxygenation: Pre-oxygenation with 100% oxygen Induction Type: IV induction Ventilation: Mask ventilation without difficulty Laryngoscope Size: 3 and McGrath Grade View: Grade I Tube type: Oral Tube size: 6.5 mm Number of attempts: 1 Airway Equipment and Method: Stylet and Oral airway Placement Confirmation: ETT inserted through vocal cords under direct vision, positive ETCO2 and breath sounds checked- equal and bilateral Secured at: 22 cm Tube secured with: Tape Dental Injury: Teeth and Oropharynx as per pre-operative assessment

## 2024-06-15 NOTE — Anesthesia Postprocedure Evaluation (Signed)
 Anesthesia Post Note  Patient: Lauren Barron  Procedure(s) Performed: HYSTERECTOMY, TOTAL, LAPAROSCOPIC, ROBOT-ASSISTED WITH SALPINGECTOMY (Bilateral: Uterus) CYSTOSCOPY (Bladder)  Patient location during evaluation: PACU Anesthesia Type: General Level of consciousness: awake and alert Pain management: pain level controlled Vital Signs Assessment: post-procedure vital signs reviewed and stable Respiratory status: spontaneous breathing, nonlabored ventilation, respiratory function stable and patient connected to nasal cannula oxygen Cardiovascular status: blood pressure returned to baseline and stable Postop Assessment: no apparent nausea or vomiting Anesthetic complications: no   No notable events documented.   Last Vitals:  Vitals:   06/15/24 1151 06/15/24 1225  BP: (!) 143/86 (!) 142/71  Pulse: 74 73  Resp: 16 18  Temp: 36.7 C   SpO2: 100% 100%    Last Pain:  Vitals:   06/15/24 1225  TempSrc:   PainSc: 0-No pain                 Prentice Murphy

## 2024-06-15 NOTE — Interval H&P Note (Signed)
 History and Physical Interval Note:  06/15/2024 7:39 AM  Lauren Barron  has presented today for surgery, with the diagnosis of menorrhagia.  The various methods of treatment have been discussed with the patient and family. After consideration of risks, benefits and other options for treatment, the patient has consented to  Procedure(s): HYSTERECTOMY, TOTAL, LAPAROSCOPIC, ROBOT-ASSISTED WITH SALPINGECTOMY (Bilateral) CYSTOSCOPY (N/A) EXCISION, CYST, OVARY, ROBOT-ASSISTED, LAPAROSCOPIC (N/A) as a surgical intervention.  The patient's history has been reviewed, patient examined, no change in status, stable for surgery.  I have reviewed the patient's chart and labs.  Questions were answered to the patient's satisfaction.     Heather Penton

## 2024-06-15 NOTE — Discharge Instructions (Addendum)
 Discharge instructions after  robotically-assisted total laparoscopic hysterectomy   For the next three days, take ibuprofen  and acetaminophen  on a schedule, every 8 hours. You can take them together or you can intersperse them, and take one every four hours. I also gave you gabapentin  for nighttime, to help you sleep and also to control pain. Take gabapentin  medicines at night for at least the next 3 nights. You also have a narcotic, oxycodone , to take as needed if the above medicines don't help. (If you have previously had an allergic reaction to ibuprofen , please don't use it)  Postop constipation is a major cause of pain. Stay well hydrated, walk as you tolerate, and take over the counter senna as well as stool softeners if you need them.   Signs and Symptoms to Report Call our office at [phone] if you have any of the following.   Fever over 100.4 degrees or higher  Severe stomach pain not relieved with pain medications  Bright red bleeding that's heavier than a period that does not slow with rest  To go the bathroom a lot (frequency), you can't hold your urine (urgency), or it hurts when you empty your bladder (urinate)  Chest pain  Shortness of breath  Pain in the calves of your legs  Severe nausea and vomiting not relieved with anti-nausea medications  Signs of infection around your wounds, such as redness, hot to touch, swelling, green/yellow drainage (like pus), bad smelling discharge  Any concerns  What You Can Expect after Surgery  You may see some pink tinged, bloody fluid and bruising around the wound. This is normal.  You may notice shoulder and neck pain. This is caused by the gas used during surgery to expand your abdomen so your surgeon could get to the uterus easier.  You may have a sore throat because of the tube in your mouth during general anesthesia. This will go away in 2 to 3 days.  You may have some stomach cramps.  You may notice spotting on your  panties.  You may have pain around the incision sites.   Activities after Your Discharge Follow these guidelines to help speed your recovery at home:  Do the coughing and deep breathing as you did in the hospital for 2 weeks. Use the small blue breathing device, called the incentive spirometer for 2 weeks.  Don't drive if you are in pain or taking narcotic pain medicine. You may drive when you can safely slam on the brakes, turn the wheel forcefully, and rotate your torso comfortably. This is typically 1-2 weeks. Practice in a parking lot or side street prior to attempting to drive regularly.   Ask others to help with household chores for 4 weeks.  Do not lift anything heavier that 10 pounds for 4-6 weeks. This includes pets, children, and groceries.  Don't do strenuous activities, exercises, or sports like vacuuming, tennis, squash, etc. until your doctor says it is safe to do so. ---Maintain pelvic rest for 12 weeks. This means nothing in the vagina or rectum at all (no douching, tampons, intercourse) for 12 weeks.   Walk as you feel able. Rest often since it may take two or three weeks for your energy level to return to normal.   You may climb stairs  Avoid constipation:   -Eat fruits, vegetables, and whole grains. Eat small meals as your appetite will take time to return to normal.   -Drink 6 to 8 glasses of water each day unless your  doctor has told you to limit your fluids.   -Use a laxative or stool softener as needed if constipation becomes a problem. You may take Miralax, metamucil, Citrucil, Colace, Senekot, FiberCon, etc. If this does not relieve the constipation, try two tablespoons of Milk Of Magnesia every 8 hours until your bowels move.   You may shower. Gently wash the wounds with a mild soap and water. Pat dry.  Do not get in a hot tub, swimming pool, etc. for 6 weeks.  Do not use lotions, oils, powders on the wounds.  Do not douche, use tampons, or have sex until your doctor  says it is okay.  Take your pain medicine when you need it. The medicine may not work as well if the pain is bad.  Take the medicines you were taking before surgery. Other medications you may need are pain medications (Norco or Percocet) and nausea medications (Zofran ).   Here is a helpful article from the website http://mitchell.org/, regarding constipation  Here are reasons why constipation occurs after surgery: 1) During the operation and in the recovery room, most people are given opioid pain medication, primarily through an IV, to treat moderate or severe pain. Intravenous opioids include morphine, Dilaudid and fentanyl . After surgery, patients are often prescribed opioid pain medication to take by mouth at home, including codeine, Vicodin, Norco, and Percocet. All of these medications cause constipation by slowing down the movement of your intestine. 2) Changes in your diet before surgery can be another culprit. It is common to get specific instructions to change how you normally eat or drink before your surgery, like only having liquids the day before or not having anything to eat or drink after midnight the night before surgery. For this reason, temporary dehydration may occur. This, along with not eating or only having liquids, means that you are getting less fiber than usual. Both these factors contribute to constipation. 3) Changes in your diet after surgery can also contribute to the problem. Although many people don't have dietary restrictions after operations, being under anesthesia can make you lose your appetite for several hours and maybe even days. Some people can even have nausea or vomiting. Not eating or drinking normally means that you are not getting enough fiber and you can get dehydrated, both leading to constipation. 4) Lying in a bed more than usual--which happens before, during and after surgery--combined with the medications and diet changes, all work together to slow down your  colon and make your poop turn to rock.  No one likes to be constipated.  Let's face it, it's not a pleasant feeling when you don't poop for days, then strain on the toilet to finally pass something large enough to cause damage. An ounce of prevention is worth a pound of cure, so: Assume you will be constipated. Plan and prepare accordingly. Post-surgery is one of those unique situations where the temporary use of laxatives can make a world of difference. Always consult with your doctor, and recognize that if you wait several days after surgery to take a laxative, the constipation might be too severe for these over-the-counter options. It is always important to discuss all medications you plan on taking with your doctor. Ask your doctor if you can start the laxative immediately after surgery. *  Here are go-to post-surgery laxatives: Senna: Senna is an herb that acts as a "stimulant laxative," meaning it increases the activity of the intestine to cause you to have a bowel movement. It comes in  many forms, but senna pills are easy to take and are sold over the counter at almost all pharmacies. Since opioid pain medications slow down the activity of the intestine, it makes sense to take a medication to help reverse that side effect. Long-term use of a stimulant laxative is not a good idea since it can make your colon "lazy" and not function properly; however, temporary use immediately after surgery is acceptable. In general, if you are able to eat a normal diet, taking senna soon after surgery works the best. Senna usually works within hours to produce a bowel movement, but this is less predictable when you are taking different medications after surgery. Try not to wait several days to start taking senna, as often it is too late by then. Just like with all medications or supplements, check with your doctor before starting new treatment.   Magnesium: Magnesium is an important mineral that our body needs. We  get magnesium from some foods that we eat, especially foods that are high in fiber such as broccoli, almonds and whole grains. There are also magnesium-based medications used to treat constipation including milk of magnesia (magnesium hydroxide), magnesium citrate and magnesium oxide. They work by drawing water into the intestine, putting it into the class of "osmotic" laxatives. Magnesium products in low doses appear to be safe, but if taken in very large doses, can lead to problems such as irregular heartbeat, low blood pressure and even death. It can also affect other medications you might be taking, therefore it is important to discuss using magnesium with your physician and pharmacist before initiating therapy. Most over-the-counter magnesium laxatives work very well to help with the constipation related to surgery, but sometimes they work too well and lead to diarrhea. Make sure you are somewhere with easy access to a bathroom, just in case.   Bisacodyl: Bisacodyl (generic name) is sold under brand names such as Dulcolax. Much like senna, it is a "stimulant laxative," meaning it makes your intestines move more quickly to push out the stool. This is another good choice to start taking as soon as your doctor says you can take a laxative after surgery. It comes in pill form and as a suppository, which is a good choice for people who cannot or are not allowed to swallow pills. Studies have shown that it works as a laxative, but like most of these medications, you should use this on a short-term basis only.   Enema: Enemas strike fear in many people, but FEAR NOT! It's nowhere near as big a deal as you may think. An enema is just a way to get some liquid into your rectum by placing a specially designed device through your anus. If you have never done one, it might seem like a painful, unpleasant, uncomfortable, complicated and lengthy procedure. But in reality, it's simple, takes just a few seconds and is  highly effective. The small ready-made bottles you buy at the pharmacy are much easier than the hose/large rubber container type. Those recommended positions illustrated in some instructions are generally not necessary to place the enema. It's very similar to the insertion of a tampon, requiring a slight squat. Some extra lubrication on the enema's tip (or on your anus) will make it a breeze. In certain cases, there is no substitute for a good enema. For example, if someone has not pooped for a few days, the beginning of the poop waiting to come out can become rock hard. Passing that hard stool can  lead to much pain and problems like anal fissures. Inserting a little liquid to break up the rock-hard stool will help make its passage much easier. Enemas come with different liquids. Most come with saline, but there are also mineral oil options. You can also use warm water in the reusable enema containers. They all work. But since saline can sometimes be irritating, so try a mineral oil or water enema instead.  Here are commonly recommended constipation medications that do not work well for post-surgery constipation: Docusate: Docusate (generic name) most commonly referred to as Colace (brand name) is not really a laxative, but is classified as a stool softener. Although this medication is commonly prescribed, it is not recommended for several reasons: 1) there is no good medical evidence that it works 2) even if it has an effect, which is very questionable, it is minimal and cannot combat the intestinal slowing caused by the opioid medications. Skip docusate to save money and space in your pillbox for something more effective.  PEG: Miralax (brand name) is basically a chemical called polyethylene glycol (PEG) and it has gained tremendous popularity as a laxative. This product is an "osmotic laxative" meaning it works by pulling water into the stool, making it softer. This is very similar to the action of natural  fiber in foods and supplements. Therefore, the effect seen by this medication is not immediate, causing a bowel movement in a day or more. Is this medication strong enough to battle the constipation related to having an operation? Maybe for some people not prone to constipation. But for most people, other laxatives are better to prevent constipation after surgery.

## 2024-06-15 NOTE — Op Note (Signed)
 [facility] PROCEDURE DATE: 06/15/2024  PREOPERATIVE DIAGNOSIS: Menometrorrhagia, pelvic pain POSTOPERATIVE DIAGNOSIS: The same PROCEDURE:  HYSTERECTOMY, TOTAL, LAPAROSCOPIC, ROBOT-ASSISTED WITH SALPINGECTOMY: 58571 (CPT)  CYSTOSCOPY: 52000 (CPT)   SURGEON:  Dr. Heather Penton, MD ASSISTANT: Dr. Garnette Mace, MD Anesthesiologist:  Anesthesiologist: Dario Barter, MD CRNA: Belinda Salm, CRNA; Rosine Shona Jansky, CRNA  INDICATIONS: 48 y.o. F  here for definitive surgical management secondary to the indications listed under preoperative diagnoses; please see preoperative note for further details.  Risks of surgery were discussed with the patient including but not limited to: bleeding which may require transfusion or reoperation; infection which may require antibiotics; injury to bowel, bladder, ureters or other surrounding organs; need for additional procedures; thromboembolic phenomenon, incisional problems and other postoperative/anesthesia complications. Written informed consent was obtained.    FINDINGS:    External genitalia, vaginal canal and cervix negative for lesions.  This cervix was significantly enlarged, and the common Vcare that we use was not employed.  Instead, we used a Psychologist, forensic.  Intraoperative findings revealed a normal upper abdomen including bowel, diaphragmatic surfaces, stomach, and omentum. Liver appeared dull with localized areas of pale yellow fibrosis, particularly at the inferior edge.  The uterus was small and mobile.  The right and left ovaries appeared normal. No complex cyst noted on the left ovary, though a simple cyst with straw-colored fluid was noted on the right.  Bilateral tubes appeared normal with disruption from prior BTL  Appendix wnl   ANESTHESIA:    General INTRAVENOUS FLUIDS:500  ml ESTIMATED BLOOD LOSS:10 ml URINE OUTPUT: 300 ml  SPECIMENS: Uterus, cervix, bilateral fallopian tubes COMPLICATIONS: None  immediate   RATLH/BS:  PROCEDURE IN DETAIL: After informed consent was obtained, the patient was taken to the operating room where general anesthesia was obtained without difficulty. The patient was positioned in the dorsal lithotomy position in Viola stirrups and her arms were carefully tucked at her sides and the usual precautions were taken. Deep Trendelenburg (20-25 deg) was established to confirm that she does not shift on the table.  She was prepped and draped in normal sterile fashion.  Time-out was performed and a Foley catheter was placed into the bladder. A standard RUMI uterine manipulator was then placed in the uterus without incident.  Preoperative prophylactic antibiotics were given through her iv.  After infiltration of local anesthetic at the proposed trocar sites, an 8 mm incision was created at the umbilicus, and an AirSeal 5mm was placed under direct visualization, after confirmation of OG tube working well. Pneumoperitoneum was created to a pressure of 15 mm Hg. The camera was placed and the abdomin surveyed, noting intact bowel below the site of entry. A survey of the pelvis and upper abdomen revealed the above findings. One right and one left lateral 8-mm robotic ports were placed under direct visualization.  The patient was placed in deepTrendelenburg and the bowel was displaced up into the upper abdomen. The robot was left side docked. The instruments were placed under direct visualization.   The ureters were identified bilaterally coursing outside of the operative field. Round ligaments were divided on each side with the EndoShears and the retroperitoneal space was opened bilaterally. The posterior leaflet of the broad was taken down to the level of the IP ligament. The anterior leaflet of the broad ligament was carefully taken down to the midline.  A bladder flap was created and the bladder was dissected down off the lower uterine segment and cervix using endoshears and  electrocautery.  The Fallopian tubes were divided from the ovaries, and care taken to hemostatically transect the utero-ovarian ligament. The peritoneum was taken down to the level of the internal os, and the uterine arteries skeletonized. With strong cephalad pressure from the V-care, bipolar cautery was used to seal and transect the uterine arteries, and the pedicles allowed to fall away laterally.  A colpotomy was performed circumferentially along the RUMI ring with monopolar electrocautery and the cervix was incised from the vagina using the laparoscopic scissors. The specimen was removed through the vagina.  A pneumo balloon was placed in the vagina and the vaginal cuff was then closed in a running continuous fashion using the  0 V-Lock suture with careful attention to include the vaginal cuff angles, the uterosacral ligaments and the vaginal mucosa within the closure.  Hemostasis was secured with intraabdominal pressure and review of all surgical sites. The intraperitoneal pressure was dropped, and all planes of dissection, vascular pedicles and the vaginal cuff were found to be hemostatic.  The robot was undocked.   Attention was turned to the bladder and cystoscopy showed vigorous bilateral ureteral jets.  No stitches were visualized in the bladder during cystoscopy.  The lateral trocars were removed under visualization.  The CO2 gas was released and several deep breaths given to remove any remaining CO2 from the peritoneal cavity.  The skin incisions were closed with 4-0 Monocryl subcuticular stitch and Dermabond.     Anesthesia was reversed without difficulty.  The patient tolerated the procedure well.  Sponge, lap and needle counts were correct x2.  The patient was taken to recovery room in excellent condition.

## 2024-06-15 NOTE — Anesthesia Preprocedure Evaluation (Signed)
 Anesthesia Evaluation  Patient identified by MRN, date of birth, ID band Patient awake    Reviewed: Allergy & Precautions, H&P , NPO status , Patient's Chart, lab work & pertinent test results, reviewed documented beta blocker date and time   History of Anesthesia Complications Negative for: history of anesthetic complications  Airway Mallampati: II  TM Distance: >3 FB Neck ROM: full    Dental   Pulmonary neg pulmonary ROS   Pulmonary exam normal breath sounds clear to auscultation       Cardiovascular Exercise Tolerance: Good (-) hypertension(-) angina (-) Past MI and (-) Cardiac Stents Normal cardiovascular exam+ dysrhythmias (-) Valvular Problems/Murmurs Rhythm:regular Rate:Normal  ECHO 05/20/23: NORMAL LEFT VENTRICULAR SYSTOLIC FUNCTION  NORMAL RIGHT VENTRICULAR SYSTOLIC FUNCTION  TRIVIAL REGURGITATION NOTED (See above)  NO VALVULAR STENOSIS  IRREGULAR HEART RHYTHM CAPTURED THROUGHOUT EXAM  ESTIMATED LVEF >55%  CALCULATED: 58.2%  GLS: -17.7%     Neuro/Psych neg Seizures PSYCHIATRIC DISORDERS Anxiety     TIA   GI/Hepatic negative GI ROS, Neg liver ROS,,,  Endo/Other  negative endocrine ROS    Renal/GU negative Renal ROS  negative genitourinary   Musculoskeletal   Abdominal   Peds  Hematology negative hematology ROS (+)   Anesthesia Other Findings Past Medical History: No date: Anemia No date: Chronic fatigue No date: Dysmenorrhea No date: Family history of adverse reaction to anesthesia     Comment:  mom-n/v No date: Heart palpitations No date: Inappropriate sinus tachycardia (HCC) No date: Left ovarian cyst No date: Menorrhagia with regular cycle No date: Raynaud's disease No date: Stress incontinence   Reproductive/Obstetrics negative OB ROS                              Anesthesia Physical Anesthesia Plan  ASA: 2  Anesthesia Plan: General   Post-op Pain Management:     Induction: Intravenous  PONV Risk Score and Plan: 3 and Ondansetron , Dexamethasone , Midazolam  and Treatment may vary due to age or medical condition  Airway Management Planned: Oral ETT  Additional Equipment:   Intra-op Plan:   Post-operative Plan: Extubation in OR  Informed Consent: I have reviewed the patients History and Physical, chart, labs and discussed the procedure including the risks, benefits and alternatives for the proposed anesthesia with the patient or authorized representative who has indicated his/her understanding and acceptance.     Dental Advisory Given  Plan Discussed with: Anesthesiologist, CRNA and Surgeon  Anesthesia Plan Comments:          Anesthesia Quick Evaluation

## 2024-06-15 NOTE — Progress Notes (Signed)
 Urine pregnancy negative, pt c/o burning and itching in IV site after IV antibiotics started> IV antibiotics stopped per Dr Verdon.

## 2024-06-15 NOTE — Transfer of Care (Signed)
 Immediate Anesthesia Transfer of Care Note  Patient: Lauren Barron  Procedure(s) Performed: HYSTERECTOMY, TOTAL, LAPAROSCOPIC, ROBOT-ASSISTED WITH SALPINGECTOMY (Bilateral: Uterus) CYSTOSCOPY (Bladder)  Patient Location: PACU  Anesthesia Type:General  Level of Consciousness: drowsy  Airway & Oxygen Therapy: Patient Spontanous Breathing and Patient connected to face mask oxygen  Post-op Assessment: Report given to RN  Post vital signs: stable  Last Vitals:  Vitals Value Taken Time  BP 112/79 06/15/24 09:37  Temp    Pulse 66 06/15/24 09:41  Resp 16 06/15/24 09:41  SpO2 100 % 06/15/24 09:41  Vitals shown include unfiled device data.  Last Pain:  Vitals:   06/15/24 0619  TempSrc: Temporal  PainSc: 0-No pain      Patients Stated Pain Goal: 0 (06/15/24 9380)  Complications: No notable events documented.

## 2024-06-16 ENCOUNTER — Encounter: Payer: Self-pay | Admitting: Obstetrics and Gynecology

## 2024-06-19 LAB — SURGICAL PATHOLOGY

## 2024-06-26 LAB — POCT PREGNANCY, URINE: Preg Test, Ur: NEGATIVE

## 2024-08-10 ENCOUNTER — Other Ambulatory Visit: Payer: Self-pay | Admitting: Family Medicine

## 2024-08-10 ENCOUNTER — Ambulatory Visit
Admission: RE | Admit: 2024-08-10 | Discharge: 2024-08-10 | Disposition: A | Discharge status: Home / Self Care | Source: Ambulatory Visit | Attending: Family Medicine | Admitting: Family Medicine

## 2024-08-10 ENCOUNTER — Other Ambulatory Visit
Admission: RE | Admit: 2024-08-10 | Discharge: 2024-08-10 | Disposition: A | Source: Ambulatory Visit | Attending: Family Medicine | Admitting: Family Medicine

## 2024-08-10 DIAGNOSIS — R7989 Other specified abnormal findings of blood chemistry: Secondary | ICD-10-CM | POA: Diagnosis present

## 2024-08-10 DIAGNOSIS — R0602 Shortness of breath: Secondary | ICD-10-CM | POA: Insufficient documentation

## 2024-08-10 DIAGNOSIS — Z90710 Acquired absence of both cervix and uterus: Secondary | ICD-10-CM

## 2024-08-10 DIAGNOSIS — R1031 Right lower quadrant pain: Secondary | ICD-10-CM

## 2024-08-10 LAB — D-DIMER, QUANTITATIVE: D-Dimer, Quant: 0.52 ug{FEU}/mL — ABNORMAL HIGH (ref 0.00–0.50)

## 2024-08-10 MED ORDER — IOHEXOL 350 MG/ML SOLN
75.0000 mL | Freq: Once | INTRAVENOUS | Status: AC | PRN
Start: 2024-08-10 — End: 2024-08-10
  Administered 2024-08-10: 75 mL via INTRAVENOUS

## 2024-08-11 ENCOUNTER — Other Ambulatory Visit: Payer: Self-pay | Admitting: Family Medicine

## 2024-08-11 ENCOUNTER — Ambulatory Visit
Admission: RE | Admit: 2024-08-11 | Discharge: 2024-08-11 | Disposition: A | Discharge status: Home / Self Care | Source: Ambulatory Visit | Attending: Family Medicine | Admitting: Family Medicine

## 2024-08-11 DIAGNOSIS — Z90710 Acquired absence of both cervix and uterus: Secondary | ICD-10-CM

## 2024-08-11 DIAGNOSIS — R1031 Right lower quadrant pain: Secondary | ICD-10-CM

## 2024-08-11 DIAGNOSIS — R1032 Left lower quadrant pain: Secondary | ICD-10-CM | POA: Diagnosis present
